# Patient Record
Sex: Male | Born: 1980
Health system: Southern US, Community
[De-identification: ages and names within clinical notes are randomized; demographics above are authoritative.]

## PROBLEM LIST (undated history)

## (undated) DIAGNOSIS — T7840XA Allergy, unspecified, initial encounter: Secondary | ICD-10-CM

## (undated) DIAGNOSIS — K219 Gastro-esophageal reflux disease without esophagitis: Secondary | ICD-10-CM

## (undated) DIAGNOSIS — N189 Chronic kidney disease, unspecified: Secondary | ICD-10-CM

## (undated) DIAGNOSIS — F909 Attention-deficit hyperactivity disorder, unspecified type: Secondary | ICD-10-CM

## (undated) DIAGNOSIS — F329 Major depressive disorder, single episode, unspecified: Secondary | ICD-10-CM

## (undated) DIAGNOSIS — F319 Bipolar disorder, unspecified: Secondary | ICD-10-CM

## (undated) DIAGNOSIS — F32A Depression, unspecified: Secondary | ICD-10-CM

## (undated) DIAGNOSIS — R630 Anorexia: Secondary | ICD-10-CM

## (undated) DIAGNOSIS — F4010 Social phobia, unspecified: Secondary | ICD-10-CM

## (undated) DIAGNOSIS — Z5189 Encounter for other specified aftercare: Secondary | ICD-10-CM

## (undated) DIAGNOSIS — R112 Nausea with vomiting, unspecified: Secondary | ICD-10-CM

## (undated) HISTORY — DX: Attention-deficit hyperactivity disorder, unspecified type: F90.9

## (undated) HISTORY — DX: Nausea with vomiting, unspecified: R11.2

## (undated) HISTORY — DX: Allergy, unspecified, initial encounter: T78.40XA

## (undated) HISTORY — PX: HERNIA REPAIR: SHX51

## (undated) HISTORY — DX: Anorexia: R63.0

## (undated) HISTORY — DX: Social phobia, unspecified: F40.10

## (undated) HISTORY — DX: Encounter for other specified aftercare: Z51.89

## (undated) HISTORY — DX: Major depressive disorder, single episode, unspecified: F32.9

## (undated) HISTORY — DX: Depression, unspecified: F32.A

## (undated) HISTORY — DX: Gastro-esophageal reflux disease without esophagitis: K21.9

---
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
## 2016-08-16 HISTORY — PX: HIATAL HERNIA REPAIR: SHX195

## 2017-12-29 ENCOUNTER — Encounter: Payer: Self-pay | Admitting: Family Medicine

## 2017-12-29 ENCOUNTER — Ambulatory Visit (INDEPENDENT_AMBULATORY_CARE_PROVIDER_SITE_OTHER): Payer: Self-pay | Admitting: Family Medicine

## 2017-12-29 VITALS — BP 108/76 | HR 87 | Temp 98.3°F | Ht 71.0 in | Wt 186.0 lb

## 2017-12-29 DIAGNOSIS — G2581 Restless legs syndrome: Secondary | ICD-10-CM

## 2017-12-29 MED ORDER — ROPINIROLE HCL 0.5 MG PO TABS: ORAL_TABLET | ORAL | 1 refills | Status: DC

## 2017-12-29 NOTE — Patient Instructions (Addendum)
Do not take medicine until I get you the results of your labs. If you do need to take it, use it nightly.  Consider wrapping something involving compression like long Tirso's or compression wear.  Let us know if you need anything.

## 2017-12-29 NOTE — Progress Notes (Signed)
Pre visit review using our clinic review tool, if applicable. No additional management support is needed unless otherwise documented below in the visit note. 

## 2017-12-29 NOTE — Progress Notes (Signed)
Chief Complaint  Patient presents with  . Establish Care       New Patient Visit SUBJECTIVE: HPI: Reginald Dunn is an 37 y.o.male who is being seen for establishing care.  The patient was previously seen in [place] before moving down with parents.   Restless leg symptoms over past couple years, getting worse. Feels urge to move legs, worse when he's lying down, better when he moves it. Usually at night. No pain or injury. No areas of easy bruising or bleeding. Mom and dad do not have this issue. No caffeine correlation. Denies weakness, numbness or tingling. Has tried massage and oils without relief.  No Known Allergies  Past Medical History:  Diagnosis Date  . ADHD   . Depression   . Social anxiety disorder    Past Surgical History:  Procedure Laterality Date  . HIATAL HERNIA REPAIR  08/2016   Social History   Socioeconomic History  . Marital status: Single  Tobacco Use  . Smoking status: Current Every Day Smoker  . Smokeless tobacco: Never Used  Substance and Sexual Activity  . Alcohol use: Yes    Comment: socially   History reviewed. No pertinent family history.   Current Outpatient Medications:  .  clonazePAM (KLONOPIN) 0.5 MG tablet, Take 0.5 mg by mouth 2 (two) times daily., Disp: , Rfl:  .  gabapentin (NEURONTIN) 600 MG tablet, Take 600 mg by mouth 2 (two) times daily., Disp: , Rfl:  .  QUEtiapine (SEROQUEL) 400 MG tablet, Take 2 and 1/2 tablets by mouth daily, Disp: , Rfl:  .  risperiDONE (RISPERDAL) 1 MG tablet, Take 5 at night, Disp: , Rfl:  .  rOPINIRole (REQUIP) 0.5 MG tablet, Take 1/2 tab 3 hours before bed for 2 days then 1 tab daily 3 hours before bed., Disp: 30 tablet, Rfl: 1  ROS Cardiovascular: Denies chest pain  Respiratory: Denies dyspnea   OBJECTIVE: BP 108/76 (BP Location: Left Arm, Patient Position: Sitting, Cuff Size: Normal)   Pulse 87   Temp 98.3 F (36.8 C) (Oral)   Ht 5\' 11"  (1.803 m)   Wt 186 lb (84.4 kg)   SpO2 97%   BMI 25.94 kg/m    Constitutional: -  VS reviewed -  Well developed, well nourished, appears stated age -  No apparent distress  Psychiatric: -  Oriented to person, place, and time -  Memory intact -  Affect and mood normal -  Fluent conversation, good eye contact -  Judgment and insight age appropriate  Eye: -  Conjunctivae clear, no discharge -  Pupils symmetric, round, reactive to light  ENMT: -  MMM    Pharynx moist, no exudate, no erythema  Neck: -  No gross swelling, no palpable masses -  Thyroid midline, not enlarged, mobile, no palpable masses  Cardiovascular: -  RRR -  No LE edema  Respiratory: -  Normal respiratory effort, no accessory muscle use, no retraction -  Breath sounds equal, no wheezes, no ronchi, no crackles  Gastrointestinal: -  Bowel sounds normal -  No tenderness, no distention, no guarding, no masses  Neurological:  -  CN II - XII grossly intact -  DTR's equal and symmetric throughout -  Sensation grossly intact to light touch, equal bilaterally  Musculoskeletal: -  No clubbing, no cyanosis -  Gait normal -  5/5 strength throughout  Skin: -  No significant lesion on inspection -  Warm and dry to palpation   ASSESSMENT/PLAN: Restless legs syndrome (RLS) -  Plan: rOPINIRole (REQUIP) 0.5 MG tablet, CBC, Comprehensive metabolic panel, IBC panel, Ferritin  Ck Fe and electrolytes.  Requip if no lab abn. Patient should return in 6 weeks to recheck med. The patient voiced understanding and agreement to the plan.   [name] Paul [place], DO 12/29/17  2:49 PM

## 2017-12-30 LAB — CBC
HEMATOCRIT: 41.5 % (ref 39.0–52.0)
Hemoglobin: 14.5 g/dL (ref 13.0–17.0)
MCHC: 35 g/dL (ref 30.0–36.0)
MCV: 92.7 fl (ref 78.0–100.0)
PLATELETS: 196 10*3/uL (ref 150.0–400.0)
RBC: 4.48 Mil/uL (ref 4.22–5.81)
RDW: 13.5 % (ref 11.5–15.5)
WBC: 8.1 10*3/uL (ref 4.0–10.5)

## 2017-12-30 LAB — COMPREHENSIVE METABOLIC PANEL
ALBUMIN: 4.4 g/dL (ref 3.5–5.2)
ALT: 17 U/L (ref 0–53)
AST: 15 U/L (ref 0–37)
Alkaline Phosphatase: 71 U/L (ref 39–117)
BUN: 15 mg/dL (ref 6–23)
CHLORIDE: 101 meq/L (ref 96–112)
CO2: 30 mEq/L (ref 19–32)
Calcium: 10.2 mg/dL (ref 8.4–10.5)
Creatinine, Ser: 1.22 mg/dL (ref 0.40–1.50)
GFR: 70.81 mL/min (ref >60.00)
Glucose, Bld: 92 mg/dL (ref 70–99)
POTASSIUM: 4.3 meq/L (ref 3.5–5.1)
Sodium: 138 mEq/L (ref 135–145)
Total Bilirubin: 0.3 mg/dL (ref 0.2–1.2)
Total Protein: 7.1 g/dL (ref 6.0–8.3)

## 2017-12-30 LAB — IBC PANEL
IRON: 71 ug/dL (ref 42–165)
SATURATION RATIOS: 16.8 % — AB (ref 20.0–50.0)
Transferrin: 302 mg/dL (ref 212.0–360.0)

## 2017-12-30 LAB — FERRITIN: FERRITIN: 31.4 ng/mL (ref 22.0–322.0)

## 2018-02-09 ENCOUNTER — Ambulatory Visit (INDEPENDENT_AMBULATORY_CARE_PROVIDER_SITE_OTHER): Payer: Self-pay | Admitting: Family Medicine

## 2018-02-09 ENCOUNTER — Encounter: Payer: Self-pay | Admitting: Family Medicine

## 2018-02-09 VITALS — BP 102/76 | HR 90 | Temp 98.4°F | Ht 71.0 in | Wt 185.4 lb

## 2018-02-09 DIAGNOSIS — G2581 Restless legs syndrome: Secondary | ICD-10-CM

## 2018-02-09 MED ORDER — ROPINIROLE HCL 2 MG PO TABS: 2.0000 mg | ORAL_TABLET | Freq: Every day | ORAL | 1 refills | Status: DC

## 2018-02-09 NOTE — Progress Notes (Signed)
Pre visit review using our clinic review tool, if applicable. No additional management support is needed unless otherwise documented below in the visit note. 

## 2018-02-09 NOTE — Patient Instructions (Addendum)
Take the new medicine tonight.   Send me a MyChart message in 2 weeks if you are not doing any better.  Try to take 1 iron pill daily to see if it upsets your stomach.  Let us know if you need anything.

## 2018-02-09 NOTE — Progress Notes (Signed)
Chief Complaint  Patient presents with  . Follow-up    restless legs   Reginald RuizJohn is a 37 year old white male here for follow-up RLS.  He was started on a few days of 0.5 mg nightly of Requip and increase to 1 mg nightly.  He has not noticed any change.  His ferritin levels were suboptimal at 31.4 and he was instructed to take supplemental iron, which she has been unable to tolerate due to abdominal upset.  ROS:  Neuro: +RLS  Past Medical History:  Diagnosis Date  . ADHD   . Depression   . Social anxiety disorder    BP 102/76 (BP Location: Left Arm, Patient Position: Sitting, Cuff Size: Normal)   Pulse 90   Temp 98.4 F (36.9 C) (Oral)   Ht 5\' 11"  (1.803 m)   Wt 185 lb 6 oz (84.1 kg)   SpO2 96%   BMI 25.85 kg/m  Gen- awake Lungs- no access muscle use Neuro- gait normal, no tremor Psych- age appropriate judgment and insight, nml affect and mood  Restless legs syndrome (RLS) - Plan: rOPINIRole (REQUIP) 2 MG tablet  Increase dose to 2 mg nightly.  Send message in 2 weeks if no improvement and we will increase to 3 mg nightly.  Encouraged to take at least 1 tab daily of supplemental iron. Follow-up in 4 weeks. The patient voiced understanding and agreement to the plan.  [name] [name] 12:16 PM 02/09/18

## 2018-02-20 ENCOUNTER — Encounter: Payer: Self-pay | Admitting: Family Medicine

## 2018-02-21 ENCOUNTER — Other Ambulatory Visit: Payer: Self-pay | Admitting: Family Medicine

## 2018-02-21 MED ORDER — PRAMIPEXOLE DIHYDROCHLORIDE ER 0.375 MG PO TB24: 0.3750 mg | ORAL_TABLET | Freq: Every day | ORAL | 1 refills | Status: DC

## 2018-02-25 ENCOUNTER — Encounter: Payer: Self-pay | Admitting: Family Medicine

## 2018-02-26 MED ORDER — PROPRANOLOL HCL 20 MG PO TABS: 20.0000 mg | ORAL_TABLET | Freq: Two times a day (BID) | ORAL | 0 refills | Status: DC

## 2018-02-26 NOTE — Telephone Encounter (Signed)
Stopped Mirapex 2/2 cost. Called in propranolol, if no improvement will try to refer to Neurology.

## 2018-03-14 ENCOUNTER — Ambulatory Visit (INDEPENDENT_AMBULATORY_CARE_PROVIDER_SITE_OTHER): Payer: Self-pay | Admitting: Family Medicine

## 2018-03-14 ENCOUNTER — Encounter: Payer: Self-pay | Admitting: Family Medicine

## 2018-03-14 VITALS — BP 112/76 | HR 94 | Temp 98.4°F | Ht 71.0 in | Wt 190.0 lb

## 2018-03-14 DIAGNOSIS — G2581 Restless legs syndrome: Secondary | ICD-10-CM

## 2018-03-14 MED ORDER — PROPRANOLOL HCL 40 MG PO TABS: 40.0000 mg | ORAL_TABLET | Freq: Two times a day (BID) | ORAL | 2 refills | Status: DC

## 2018-03-14 NOTE — Progress Notes (Signed)
CC: RLS  Subjective: Patient is a 37 y.o. male here for f/u RLS.  Started on propranolol 20 mg bid and reports around 70% improvement. Still lasts as long, but not as frequent. No other areas of shaking on body, still at night. Failed Requip at various dosing and Mirapex was too expensive.   ROS: Neuro: As noted in HPI  Past Medical History:  Diagnosis Date  . ADHD   . Depression   . Social anxiety disorder     Objective: BP 112/76 (BP Location: Left Arm, Patient Position: Sitting, Cuff Size: Normal)   Pulse 94   Temp 98.4 F (36.9 C) (Oral)   Ht  (1.803 m)   Wt 190 lb (86.2 kg)   SpO2 97%   BMI 26.50 kg/m  General: Awake, appears stated age Lungs: CTAB, no rales, wheezes or rhonchi. No accessory muscle use MSK: 5/5 strength in LE's b/l Neuro: DTR's equal and symmetric b/l LE's Psych: Age appropriate judgment and insight  Assessment and Plan: Restless legs syndrome (RLS) - Plan: propranolol (INDERAL) 40 MG tablet  Increase dose to 40 mg bid from 20 mg bid to see if this can help even more.  Check in via [facility] in 6 weeks. F/u with me in 3 mo. The patient voiced understanding and agreement to the plan.  Jilda Roche [place], DO 03/14/18  10:18 AM

## 2018-03-14 NOTE — Progress Notes (Signed)
Pre visit review using our clinic review tool, if applicable. No additional management support is needed unless otherwise documented below in the visit note. 

## 2018-03-14 NOTE — Patient Instructions (Addendum)
Send me a MyChart message in 6 weeks to check in and see how we are doing.   Continue twice daily medicine.   Let us know if you need anything.

## 2018-05-05 ENCOUNTER — Encounter: Payer: Self-pay | Admitting: Family Medicine

## 2018-06-14 ENCOUNTER — Encounter: Payer: Self-pay | Admitting: Family Medicine

## 2018-06-14 DIAGNOSIS — G2581 Restless legs syndrome: Secondary | ICD-10-CM

## 2018-06-15 ENCOUNTER — Ambulatory Visit: Payer: Self-pay | Admitting: Family Medicine

## 2018-06-15 MED ORDER — PROPRANOLOL HCL 40 MG PO TABS: 40.0000 mg | ORAL_TABLET | Freq: Two times a day (BID) | ORAL | 2 refills | Status: DC

## 2018-07-30 ENCOUNTER — Encounter: Payer: Self-pay | Admitting: *Deleted

## 2018-08-19 ENCOUNTER — Ambulatory Visit (INDEPENDENT_AMBULATORY_CARE_PROVIDER_SITE_OTHER): Payer: Medicare Other | Admitting: Psychiatry

## 2018-08-19 DIAGNOSIS — F259 Schizoaffective disorder, unspecified: Secondary | ICD-10-CM

## 2018-08-19 DIAGNOSIS — F411 Generalized anxiety disorder: Secondary | ICD-10-CM | POA: Diagnosis not present

## 2018-08-19 MED ORDER — PAROXETINE HCL 20 MG PO TABS: 20.0000 mg | ORAL_TABLET | Freq: Every day | ORAL | 2 refills | Status: DC

## 2018-08-19 MED ORDER — QUETIAPINE FUMARATE 400 MG PO TABS: 1000.0000 mg | ORAL_TABLET | Freq: Every day | ORAL | 2 refills | Status: DC

## 2018-08-19 MED ORDER — RISPERIDONE 1 MG PO TABS: 5.0000 mg | ORAL_TABLET | Freq: Every day | ORAL | 2 refills | Status: DC

## 2018-08-19 MED ORDER — GABAPENTIN 600 MG PO TABS: 1200.0000 mg | ORAL_TABLET | Freq: Two times a day (BID) | ORAL | 2 refills | Status: DC

## 2018-08-19 MED ORDER — ALPRAZOLAM 1 MG PO TABS: 1.0000 mg | ORAL_TABLET | Freq: Three times a day (TID) | ORAL | 2 refills | Status: DC

## 2018-08-19 NOTE — Progress Notes (Addendum)
[facility]  Patient ID: Reginald Dunn,  MRN: [id number]  PCP: Sharlene Dory, DO  Date of Evaluation: 08/19/2018 Time spent:20 minutes   HISTORY/CURRENT STATUS: HPI patient is a 37 year old white male with diagnoses of anxiety schizoaffective disorder with history of SA ADHD.  Last seen 12 05/27/2018.  He was doing well so no change.  Individual Medical History/ Review of Systems: Changes? :No  Allergies: Patient has no known allergies.  Current Medications:  Current Outpatient Medications:  .  QUEtiapine (SEROQUEL) 400 MG tablet, Take 2.5 tablets (1,000 mg total) by mouth at bedtime. Take 2 and 1/2 tablets by mouth daily, Disp: 75 tablet, Rfl: 2 .  risperiDONE (RISPERDAL) 1 MG tablet, Take 5 tablets (5 mg total) by mouth at bedtime. Take 5 at night, Disp: 150 tablet, Rfl: 2 .  ALPRAZolam (XANAX) 1 MG tablet, Take 1 tablet (1 mg total) by mouth 3 (three) times daily., Disp: 90 tablet, Rfl: 2 .  gabapentin (NEURONTIN) 600 MG tablet, Take 2 tablets (1,200 mg total) by mouth 2 (two) times daily., Disp: 120 tablet, Rfl: 2 .  PARoxetine (PAXIL) 20 MG tablet, Take 1 tablet (20 mg total) by mouth daily., Disp: 30 tablet, Rfl: 2 .  propranolol (INDERAL) 40 MG tablet, Take 1 tablet (40 mg total) by mouth 2 (two) times daily. (Patient not taking: Reported on 08/19/2018), Disp: 60 tablet, Rfl: 2 Medication Side Effects: None  Family Medical/ Social History: Changes? No  MENTAL HEALTH EXAM:  There were no vitals taken for this visit.There is no height or weight on file to calculate BMI.  General Appearance: Casual  Eye Contact:  Good  Speech:  Normal Rate  Volume:  Normal  Mood:  Depressed  Affect:  Appropriate  Thought Process:  Linear  Orientation:  Full (Time, Place, and Person)  Thought Content: Logical   Suicidal Thoughts:  No  Homicidal Thoughts:  No  Memory:  Immediate  Judgement:  Good  Insight:  Good  Psychomotor Activity:  Normal  Concentration:   Concentration: Good  Recall:  Good  Fund of Knowledge: Good  Language: Good  Akathisia:  No  AIMS (if indicated): not done  Assets:  Desire for Improvement Social Support  ADL's:  Intact  Cognition: WNL  Prognosis:  Good    DIAGNOSES:    ICD-10-CM   1. Schizoaffective disorder, unspecified type (HCC) F25.9 QUEtiapine (SEROQUEL) 400 MG tablet    risperiDONE (RISPERDAL) 1 MG tablet  2. Generalized anxiety disorder F41.1 PARoxetine (PAXIL) 20 MG tablet    gabapentin (NEURONTIN) 600 MG tablet    QUEtiapine (SEROQUEL) 400 MG tablet    risperiDONE (RISPERDAL) 1 MG tablet    ALPRAZolam (XANAX) 1 MG tablet    RECOMMENDATIONS: Patient was instructed to increase his Paxil to 20 mg a day for his depression.  He is to call if he has any manic symptoms.  He will return in 3 months.  This patient is on Paxil he is to continue his same dose of Risperdal.    Anne Fu, PA-C

## 2018-10-28 ENCOUNTER — Other Ambulatory Visit: Payer: Self-pay | Admitting: Psychiatry

## 2018-10-28 DIAGNOSIS — F411 Generalized anxiety disorder: Secondary | ICD-10-CM

## 2018-10-31 ENCOUNTER — Telehealth: Payer: Self-pay | Admitting: Psychiatry

## 2018-10-31 NOTE — Telephone Encounter (Signed)
Need to contact pt for pharmacy and clarify medications

## 2018-10-31 NOTE — Telephone Encounter (Signed)
Patient requesting refills on meds. Appointment not scheduled until 1/7.

## 2018-11-01 ENCOUNTER — Other Ambulatory Visit: Payer: Self-pay

## 2018-11-01 DIAGNOSIS — F411 Generalized anxiety disorder: Secondary | ICD-10-CM

## 2018-11-01 DIAGNOSIS — F259 Schizoaffective disorder, unspecified: Secondary | ICD-10-CM

## 2018-11-01 MED ORDER — PAROXETINE HCL 20 MG PO TABS: 20.0000 mg | ORAL_TABLET | Freq: Every day | ORAL | 0 refills | Status: DC

## 2018-11-01 MED ORDER — QUETIAPINE FUMARATE 400 MG PO TABS: 1000.0000 mg | ORAL_TABLET | Freq: Every day | ORAL | 0 refills | Status: DC

## 2018-11-01 MED ORDER — GABAPENTIN 600 MG PO TABS: 1200.0000 mg | ORAL_TABLET | Freq: Two times a day (BID) | ORAL | 0 refills | Status: DC

## 2018-11-01 MED ORDER — RISPERIDONE 1 MG PO TABS: 5.0000 mg | ORAL_TABLET | Freq: Every day | ORAL | 0 refills | Status: DC

## 2018-11-01 MED ORDER — ALPRAZOLAM 1 MG PO TABS: 1.0000 mg | ORAL_TABLET | Freq: Three times a day (TID) | ORAL | 0 refills | Status: DC

## 2018-11-01 NOTE — Telephone Encounter (Signed)
See paper chart for refills and pharmacy

## 2018-11-22 ENCOUNTER — Ambulatory Visit (INDEPENDENT_AMBULATORY_CARE_PROVIDER_SITE_OTHER): Payer: Medicare Other | Admitting: Psychiatry

## 2018-11-22 DIAGNOSIS — F411 Generalized anxiety disorder: Secondary | ICD-10-CM

## 2018-11-22 DIAGNOSIS — F258 Other schizoaffective disorders: Secondary | ICD-10-CM | POA: Diagnosis not present

## 2018-11-22 DIAGNOSIS — F259 Schizoaffective disorder, unspecified: Secondary | ICD-10-CM

## 2018-11-22 MED ORDER — PAROXETINE HCL 20 MG PO TABS: 20.0000 mg | ORAL_TABLET | Freq: Every day | ORAL | 2 refills | Status: DC

## 2018-11-22 MED ORDER — GABAPENTIN 600 MG PO TABS: 1200.0000 mg | ORAL_TABLET | Freq: Two times a day (BID) | ORAL | 2 refills | Status: DC

## 2018-11-22 MED ORDER — ALPRAZOLAM 1 MG PO TABS: 1.0000 mg | ORAL_TABLET | Freq: Three times a day (TID) | ORAL | 2 refills | Status: DC

## 2018-11-22 MED ORDER — RISPERIDONE 1 MG PO TABS: 5.0000 mg | ORAL_TABLET | Freq: Every day | ORAL | 2 refills | Status: DC

## 2018-11-22 MED ORDER — QUETIAPINE FUMARATE 400 MG PO TABS: 1000.0000 mg | ORAL_TABLET | Freq: Every day | ORAL | 2 refills | Status: DC

## 2018-11-22 NOTE — Progress Notes (Signed)
[facility]  Patient ID: Reginald Dunn,  MRN: [id number]  PCP: Sharlene Dory, DO  Date of Evaluation: 11/22/2018 Time spent:20 minutes  Chief Complaint:   HISTORY/CURRENT STATUS: HPI seen 08/19/2018.  Basically he is doing the same.  He has mild anxiety.  He is having no manic symptoms.  He does have some depression but denies suicidal thoughts.  Individual Medical History/ Review of Systems: Changes? :No   Allergies: Patient has no known allergies.  Current Medications:  Current Outpatient Medications:  .  ALPRAZolam (XANAX) 1 MG tablet, Take 1 tablet (1 mg total) by mouth 3 (three) times daily., Disp: 90 tablet, Rfl: 0 .  gabapentin (NEURONTIN) 600 MG tablet, Take 2 tablets (1,200 mg total) by mouth 2 (two) times daily., Disp: 120 tablet, Rfl: 0 .  PARoxetine (PAXIL) 20 MG tablet, Take 1 tablet (20 mg total) by mouth daily., Disp: 30 tablet, Rfl: 0 .  propranolol (INDERAL) 40 MG tablet, Take 1 tablet (40 mg total) by mouth 2 (two) times daily. (Patient not taking: Reported on 08/19/2018), Disp: 60 tablet, Rfl: 2 .  QUEtiapine (SEROQUEL) 400 MG tablet, Take 2.5 tablets (1,000 mg total) by mouth at bedtime. Take 2 and 1/2 tablets by mouth daily, Disp: 75 tablet, Rfl: 0 .  risperiDONE (RISPERDAL) 1 MG tablet, Take 5 tablets (5 mg total) by mouth at bedtime. Take 5 at night, Disp: 150 tablet, Rfl: 0 Medication Side Effects: none  Family Medical/ Social History: Changes? no MENTAL HEALTH EXAM:  There were no vitals taken for this visit.There is no height or weight on file to calculate BMI.  General Appearance: Neat  Eye Contact:  Fair  Speech:  Normal Rate  Volume:  Normal  Mood:  Depressed  Affect:  Blunt  Thought Process:  Linear  Orientation:  Full (Time, Place, and Person)  Thought Content: Logical   Suicidal Thoughts:  No  Homicidal Thoughts:  No  Memory:  WNL  Judgement:  Good  Insight:  Good  Psychomotor Activity:  Normal  Concentration:   Concentration: Good  Recall:  Good  Fund of Knowledge: Good  Language: Good  Assets:  Desire for Improvement  ADL's:  Intact  Cognition: WNL  Prognosis:  Fair    DIAGNOSES: No diagnosis found.  Receiving Psychotherapy: No    RECOMMENDATIONS: Renal medication change for the patient today.  Per his request.  He is on Paxil 20 mg a day, Risperdal 5 mg at bedtime, Seroquel  400 mg 2-1/2 tabs a day.  He also takes Xanax 1 mg 3 times daily his mother keeps his prescription.  Gabapentin 600 mg 2 twice daily. Patient wishes to stay on same medication.  He is to return in 3 months.   Anne Fu, PA-C

## 2018-12-13 ENCOUNTER — Emergency Department (HOSPITAL_BASED_OUTPATIENT_CLINIC_OR_DEPARTMENT_OTHER)
Admission: EM | Admit: 2018-12-13 | Discharge: 2018-12-13 | Disposition: A | Discharge status: Home / Self Care | Payer: Medicare Other | Attending: Emergency Medicine | Admitting: Emergency Medicine

## 2018-12-13 ENCOUNTER — Encounter (HOSPITAL_BASED_OUTPATIENT_CLINIC_OR_DEPARTMENT_OTHER): Payer: Self-pay | Admitting: Emergency Medicine

## 2018-12-13 ENCOUNTER — Emergency Department (HOSPITAL_BASED_OUTPATIENT_CLINIC_OR_DEPARTMENT_OTHER): Payer: Medicare Other

## 2018-12-13 ENCOUNTER — Other Ambulatory Visit: Payer: Self-pay

## 2018-12-13 DIAGNOSIS — R1013 Epigastric pain: Secondary | ICD-10-CM

## 2018-12-13 DIAGNOSIS — F17210 Nicotine dependence, cigarettes, uncomplicated: Secondary | ICD-10-CM | POA: Diagnosis not present

## 2018-12-13 DIAGNOSIS — Z79899 Other long term (current) drug therapy: Secondary | ICD-10-CM | POA: Diagnosis not present

## 2018-12-13 DIAGNOSIS — R112 Nausea with vomiting, unspecified: Secondary | ICD-10-CM | POA: Diagnosis not present

## 2018-12-13 DIAGNOSIS — K449 Diaphragmatic hernia without obstruction or gangrene: Secondary | ICD-10-CM | POA: Diagnosis not present

## 2018-12-13 DIAGNOSIS — F909 Attention-deficit hyperactivity disorder, unspecified type: Secondary | ICD-10-CM | POA: Insufficient documentation

## 2018-12-13 LAB — URINALYSIS, ROUTINE W REFLEX MICROSCOPIC
Bilirubin Urine: NEGATIVE
Glucose, UA: NEGATIVE mg/dL
Hgb urine dipstick: NEGATIVE
KETONES UR: 40 mg/dL — AB
Leukocytes, UA: NEGATIVE
Nitrite: NEGATIVE
PH: 8.5 — AB (ref 5.0–8.0)
Protein, ur: 30 mg/dL — AB
Specific Gravity, Urine: 1.015 (ref 1.005–1.030)

## 2018-12-13 LAB — CBC WITH DIFFERENTIAL/PLATELET
Abs Immature Granulocytes: 0.04 10*3/uL (ref 0.00–0.07)
Basophils Absolute: 0 10*3/uL (ref 0.0–0.1)
Basophils Relative: 0 %
EOS ABS: 0 10*3/uL (ref 0.0–0.5)
Eosinophils Relative: 0 %
HCT: 40.6 % (ref 39.0–52.0)
Hemoglobin: 13.9 g/dL (ref 13.0–17.0)
Immature Granulocytes: 0 %
Lymphocytes Relative: 9 %
Lymphs Abs: 1 10*3/uL (ref 0.7–4.0)
MCH: 31.9 pg (ref 26.0–34.0)
MCHC: 34.2 g/dL (ref 30.0–36.0)
MCV: 93.1 fL (ref 80.0–100.0)
Monocytes Absolute: 0.7 10*3/uL (ref 0.1–1.0)
Monocytes Relative: 6 %
Neutro Abs: 9.6 10*3/uL — ABNORMAL HIGH (ref 1.7–7.7)
Neutrophils Relative %: 85 %
Platelets: 169 10*3/uL (ref 150–400)
RBC: 4.36 MIL/uL (ref 4.22–5.81)
RDW: 13 % (ref 11.5–15.5)
WBC: 11.3 10*3/uL — ABNORMAL HIGH (ref 4.0–10.5)
nRBC: 0 % (ref 0.0–0.2)

## 2018-12-13 LAB — COMPREHENSIVE METABOLIC PANEL
ALT: 19 U/L (ref 0–44)
AST: 17 U/L (ref 15–41)
Albumin: 4.5 g/dL (ref 3.5–5.0)
Alkaline Phosphatase: 63 U/L (ref 38–126)
Anion gap: 9 (ref 5–15)
BILIRUBIN TOTAL: 0.5 mg/dL (ref 0.3–1.2)
BUN: 12 mg/dL (ref 6–20)
CO2: 22 mmol/L (ref 22–32)
Calcium: 9.3 mg/dL (ref 8.9–10.3)
Chloride: 107 mmol/L (ref 98–111)
Creatinine, Ser: 0.9 mg/dL (ref 0.61–1.24)
GFR calc Af Amer: >60 mL/min (ref >60)
GFR calc non Af Amer: >60 mL/min (ref >60)
Glucose, Bld: 115 mg/dL — ABNORMAL HIGH (ref 70–99)
POTASSIUM: 3.4 mmol/L — AB (ref 3.5–5.1)
Sodium: 138 mmol/L (ref 135–145)
TOTAL PROTEIN: 7 g/dL (ref 6.5–8.1)

## 2018-12-13 LAB — URINALYSIS, MICROSCOPIC (REFLEX)

## 2018-12-13 LAB — LIPASE, BLOOD: Lipase: 38 U/L (ref 11–51)

## 2018-12-13 MED ORDER — SODIUM CHLORIDE 0.9 % IV BOLUS
1000.0000 mL | Freq: Once | INTRAVENOUS | Status: AC
  Administered 2018-12-13: 1000 mL via INTRAVENOUS

## 2018-12-13 MED ORDER — SODIUM CHLORIDE 0.9 % IV BOLUS: 1000.0000 mL | Freq: Once | INTRAVENOUS | Status: DC

## 2018-12-13 MED ORDER — METOCLOPRAMIDE HCL 10 MG PO TABS: 10.0000 mg | ORAL_TABLET | Freq: Four times a day (QID) | ORAL | 0 refills | Status: DC

## 2018-12-13 MED ORDER — IOPAMIDOL (ISOVUE-300) INJECTION 61%
100.0000 mL | Freq: Once | INTRAVENOUS | Status: AC | PRN
  Administered 2018-12-13: 100 mL via INTRAVENOUS

## 2018-12-13 MED ORDER — ONDANSETRON HCL 4 MG/2ML IJ SOLN
4.0000 mg | Freq: Once | INTRAMUSCULAR | Status: AC
  Administered 2018-12-13: 4 mg via INTRAVENOUS
  Filled 2018-12-13: qty 2

## 2018-12-13 MED ORDER — METOCLOPRAMIDE HCL 5 MG/ML IJ SOLN
10.0000 mg | Freq: Once | INTRAMUSCULAR | Status: AC
  Administered 2018-12-13: 10 mg via INTRAVENOUS
  Filled 2018-12-13: qty 2

## 2018-12-13 MED FILL — METOCLOPRAMIDE 10 MG TABLET: 10 | 2 days supply | Qty: 8 | Fill #0

## 2018-12-13 NOTE — ED Provider Notes (Signed)
[facility] EMERGENCY DEPARTMENT Provider Note   CSN: [id number] Arrival date & time: 12/13/18  1010     History   Chief Complaint Chief Complaint  Patient presents with  . Emesis    HPI Reginald Dunn is a 38 y.o. male who presents with abdominal pain, nausea and vomiting.  Past medical history significant for hiatal hernia status post repair.  His parents are at bedside who help provide history.  [place]  Patient states that at about 1 AM he woke up with an acute onset of nausea and vomiting.  He has had multiple episodes and possible hematemesis.  His last episode of vomiting was around 9:00 this morning.  He denies fever, chills, chest pain, shortness of breath, diarrhea.  Yesterday he was in his usual state of health.  He has not had any sick contacts.  His parents note that he has had symptoms of early satiety and unintentional weight loss of about 25 pounds over the past couple of months.  HPI  Past Medical History:  Diagnosis Date  . ADHD   . Depression   . Social anxiety disorder     Patient Active Problem List   Diagnosis Date Noted  . Restless legs syndrome (RLS) 12/29/2017    Past Surgical History:  Procedure Laterality Date  . HIATAL HERNIA REPAIR  08/2016        Home Medications    Prior to Admission medications   Medication Sig Start Date End Date Taking? Authorizing Provider  ALPRAZolam Prudy Feeler(XANAX) 1 MG tablet Take 1 tablet (1 mg total) by mouth 3 (three) times daily. 11/22/18   Shugart, Mat Carnelay, PA-C  gabapentin (NEURONTIN) 600 MG tablet Take 2 tablets (1,200 mg total) by mouth 2 (two) times daily. 11/22/18   Shugart, Mat Carnelay, PA-C  PARoxetine (PAXIL) 20 MG tablet Take 1 tablet (20 mg total) by mouth daily. 11/22/18   Shugart, Mat Carnelay, PA-C  propranolol (INDERAL) 40 MG tablet Take 1 tablet (40 mg total) by mouth 2 (two) times daily. Patient not taking: Reported on 08/19/2018 06/15/18   Sharlene DoryWendling, Nicholas Paul, DO  QUEtiapine  (SEROQUEL) 400 MG tablet Take 2.5 tablets (1,000 mg total) by mouth at bedtime. Take 2 and 1/2 tablets by mouth daily 11/22/18   Shugart, Mat Carnelay, PA-C  risperiDONE (RISPERDAL) 1 MG tablet Take 5 tablets (5 mg total) by mouth at bedtime. Take 5 at night 11/22/18   Anne FuShugart, Clay, PA-C    Family History No family history on file.  Social History Social History   Tobacco Use  . Smoking status: Current Every Day Smoker    Packs/day: 1.00  . Smokeless tobacco: Never Used  Substance Use Topics  . Alcohol use: Yes    Comment: socially  . Drug use: Never     Allergies   Patient has no known allergies.   Review of Systems Review of Systems  Constitutional: Positive for unexpected weight change. Negative for chills and fever.  Respiratory: Negative for shortness of breath.   Cardiovascular: Negative for chest pain.  Gastrointestinal: Positive for abdominal pain, nausea and vomiting. Negative for blood in stool and diarrhea.  Genitourinary: Negative for difficulty urinating, dysuria and flank pain.  All other systems reviewed and are negative.    Physical Exam Updated Vital Signs BP 120/88 (BP Location: Left Arm)   Pulse 70   Temp 97.7 F (36.5 C) (Oral)   Resp 18   Ht 5\' 11"  (1.803 m)   Wt 77.1 kg  SpO2 100%   BMI 23.71 kg/m   Physical Exam Vitals signs and nursing note reviewed.  Constitutional:      General: He is not in acute distress.    Appearance: He is well-developed and well-groomed. He is ill-appearing (Pale).  HENT:     Head: Normocephalic and atraumatic.  Eyes:     General: No scleral icterus.       Right eye: No discharge.        Left eye: No discharge.     Conjunctiva/sclera: Conjunctivae normal.     Pupils: Pupils are equal, round, and reactive to light.  Neck:     Musculoskeletal: Normal range of motion.  Cardiovascular:     Rate and Rhythm: Normal rate and regular rhythm.  Pulmonary:     Effort: Pulmonary effort is normal. No respiratory distress.      Breath sounds: Normal breath sounds.  Abdominal:     General: Abdomen is flat. Bowel sounds are normal. There is no distension.     Palpations: Abdomen is soft.     Tenderness: There is abdominal tenderness (Mild epigastric).     Comments: Multiple well-healed surgical scars  Skin:    General: Skin is warm and dry.  Neurological:     Mental Status: He is alert and oriented to person, place, and time.  Psychiatric:        Behavior: Behavior normal. Behavior is cooperative.      ED Treatments / Results  Labs (all labs ordered are listed, but only abnormal results are displayed) Labs Reviewed  URINALYSIS, ROUTINE W REFLEX MICROSCOPIC - Abnormal; Notable for the following components:      Result Value   APPearance CLOUDY (*)    pH 8.5 (*)    Ketones, ur 40 (*)    Protein, ur 30 (*)    All other components within normal limits  URINALYSIS, MICROSCOPIC (REFLEX) - Abnormal; Notable for the following components:   Bacteria, UA RARE (*)    All other components within normal limits  COMPREHENSIVE METABOLIC PANEL - Abnormal; Notable for the following components:   Potassium 3.4 (*)    Glucose, Bld 115 (*)    All other components within normal limits  CBC WITH DIFFERENTIAL/PLATELET - Abnormal; Notable for the following components:   WBC 11.3 (*)    Neutro Abs 9.6 (*)    All other components within normal limits  LIPASE, BLOOD    EKG None  Radiology Ct Abdomen Pelvis W Contrast  Result Date: 12/13/2018 CLINICAL DATA:  Onset abdominal pain and vomiting last night at 1 a.m. EXAM: CT ABDOMEN AND PELVIS WITH CONTRAST TECHNIQUE: Multidetector CT imaging of the abdomen and pelvis was performed using the standard protocol following bolus administration of intravenous contrast. CONTRAST:  100 mL ISOVUE-300 IOPAMIDOL (ISOVUE-300) INJECTION 61% COMPARISON:  None. FINDINGS: Lower chest: The lung bases are clear. No pleural or pericardial effusion. Hepatobiliary: No focal liver abnormality is  seen. No gallstones, gallbladder wall thickening, or biliary dilatation. Pancreas: Unremarkable. No pancreatic ductal dilatation or surrounding inflammatory changes. Spleen: Normal in size without focal abnormality. Adrenals/Urinary Tract: Adrenal glands are unremarkable. Kidneys are normal, without renal calculi, focal lesion, or hydronephrosis. Bladder is unremarkable. Stomach/Bowel: Small hiatal hernia is noted. The stomach is otherwise unremarkable. Appendix appears normal. No evidence of bowel wall thickening, distention, or inflammatory changes. Vascular/Lymphatic: No significant vascular findings are present. No enlarged abdominal or pelvic lymph nodes. Reproductive: Prostate is unremarkable. Other: None. Musculoskeletal: Negative. IMPRESSION: No acute abnormality abdomen  or pelvis. No finding to explain the patient's symptoms. Small hiatal hernia. Electronically Signed   By: Drusilla Kanner M.D.   On: 12/13/2018 13:52    Procedures Procedures (including critical care time)  Medications Ordered in ED Medications  sodium chloride 0.9 % bolus 1,000 mL ( Intravenous Stopped 12/13/18 1300)  ondansetron (ZOFRAN) injection 4 mg (4 mg Intravenous Given 12/13/18 1145)  metoCLOPramide (REGLAN) injection 10 mg (10 mg Intravenous Given 12/13/18 1236)  iopamidol (ISOVUE-300) 61 % injection 100 mL (100 mLs Intravenous Contrast Given 12/13/18 1332)  sodium chloride 0.9 % bolus 1,000 mL ( Intravenous Stopped 12/13/18 1454)     Initial Impression / Assessment and Plan / ED Course  I have reviewed the triage vital signs and the nursing notes.  Pertinent labs & imaging results that were available during my care of the patient were reviewed by me and considered in my medical decision making (see chart for details).  38 year old male presents with acute onset of nausea and vomiting with epigastric pain since this morning.  His vital signs are normal.  On exam he appears ill and pale.  He is mildly tender in the  epigastric area.  CBC is remarkable for mild leukocytosis.  CMP is remarkable for mild hypokalemia.  LFTs and lipase are normal.  Urine has 40 ketones.  Unclear if this is acute from dehydration versus his decreased oral intake.  His parents are very concerned and said that he might have been vomiting blood earlier today.  Since he appears uncomfortable will obtain CT scan of abdomen and pelvis.  We will give an additional fluid bolus and Reglan.  Informed by nursing that the patient is feeling much better and is requesting to leave.  He was able to tolerate p.o. here.  I reevaluated the patient he appears vastly improved.  We will given prescription for Reglan and strict return precautions.  Final Clinical Impressions(s) / ED Diagnoses   Final diagnoses:  Non-intractable vomiting with nausea, unspecified vomiting type  Epigastric abdominal pain    ED Discharge Orders    None       Bethel Born, PA-C 12/13/18 1717    Tilden Fossa, MD 12/14/18 1003

## 2018-12-13 NOTE — ED Triage Notes (Signed)
Upper abdominal pain and vomiting since 1am.  Pt pale and weak.

## 2018-12-13 NOTE — ED Notes (Signed)
Pt reports feeling better at this time. Requesting to go home

## 2018-12-13 NOTE — ED Notes (Signed)
Unsuccessful attempts x 2 for IV. RN in room to attempt

## 2018-12-13 NOTE — ED Notes (Addendum)
Pt c/o N/V with epigastric pain since 1am today. Pt states felt well yesterday, ate dinner with no issues. Pt denies diarrhea, fever. No other family members sick. Pt denies chest pain or sob

## 2018-12-13 NOTE — Discharge Instructions (Signed)
Take Reglan as needed for nausea/vomiting Please return if you are worsening Follow up with your doctor

## 2018-12-13 NOTE — ED Notes (Signed)
Pt reports epigastric pain and still feeling nauseous.

## 2018-12-14 ENCOUNTER — Encounter: Payer: Self-pay | Admitting: Family Medicine

## 2018-12-14 ENCOUNTER — Ambulatory Visit: Payer: Self-pay

## 2018-12-14 NOTE — Telephone Encounter (Signed)
Pt.'s mother called to report pt. Was seen in ED yesterday with vomiting and was given Reglan. Reports Reglan has not helped and is requesting something different be sent to [facility] [address].Please let pt. Know if this is done.

## 2018-12-15 ENCOUNTER — Other Ambulatory Visit: Payer: Self-pay | Admitting: Family Medicine

## 2018-12-15 MED ORDER — ONDANSETRON 4 MG PO TBDP: 4.0000 mg | ORAL_TABLET | Freq: Three times a day (TID) | ORAL | 0 refills | Status: DC | PRN

## 2018-12-15 NOTE — Addendum Note (Signed)
Addended by: Radene Gunning on: 12/15/2018 07:23 AM   Modules accepted: Orders

## 2018-12-15 NOTE — Telephone Encounter (Signed)
Is he still having issues? I can call something in if so, it would be better in future to sched f/u appt with me if still having problems following an ER visit.

## 2019-01-27 ENCOUNTER — Telehealth: Payer: Self-pay | Admitting: Family Medicine

## 2019-01-27 MED ORDER — PROMETHAZINE HCL 25 MG PO TABS: 25.0000 mg | ORAL_TABLET | Freq: Four times a day (QID) | ORAL | 0 refills | Status: DC | PRN

## 2019-01-27 NOTE — Telephone Encounter (Signed)
Try Phenergan. Will call in. TY.

## 2019-01-27 NOTE — Telephone Encounter (Signed)
Copied from CRM [id number]. Topic: Quick Communication - Rx Refill/Question >> Jan 27, 2019  2:26 PM Maia Petties wrote: Medication: metoCLOPramide (REGLAN) 10 MG tablet & ondansetron (ZOFRAN ODT) 4 MG disintegrating tablet Mother is stating that pt has began another episode of vomiting as he did back in January - 6-8x today so far - she has tried the zofran from last time she had left and it is not helping. She is not wanting to bring Reginald Dunn out due to CV and is asking what else can be prescribed to help stop vomiting. She is willing to bring him in after CV cases begin to lower  Has the patient contacted their pharmacy? No - new request Preferred Pharmacy (with phone number or street name): [facility] [address] [place], [place] - [address] [phone] (Phone) [phone] (Fax)

## 2019-01-27 NOTE — Telephone Encounter (Signed)
Sent in and patient informed 

## 2019-02-13 ENCOUNTER — Other Ambulatory Visit: Payer: Self-pay | Admitting: Psychiatry

## 2019-02-13 DIAGNOSIS — F411 Generalized anxiety disorder: Secondary | ICD-10-CM

## 2019-02-14 NOTE — Telephone Encounter (Signed)
rx's just filled 02/05/2019, too early

## 2019-02-21 ENCOUNTER — Ambulatory Visit: Payer: Medicare Other | Admitting: Psychiatry

## 2019-03-01 NOTE — Telephone Encounter (Signed)
Yes, too early on Xanax.  Also the Paxil was sent in on 02/27/19, so not sure why we were sent that.  Please deny both.

## 2019-03-01 NOTE — Telephone Encounter (Signed)
Last fill 02/05/2019

## 2019-03-03 ENCOUNTER — Encounter: Payer: Self-pay | Admitting: Physician Assistant

## 2019-03-03 ENCOUNTER — Other Ambulatory Visit: Payer: Self-pay

## 2019-03-03 ENCOUNTER — Ambulatory Visit (INDEPENDENT_AMBULATORY_CARE_PROVIDER_SITE_OTHER): Payer: Medicare Other | Admitting: Physician Assistant

## 2019-03-03 DIAGNOSIS — F411 Generalized anxiety disorder: Secondary | ICD-10-CM

## 2019-03-03 DIAGNOSIS — F259 Schizoaffective disorder, unspecified: Secondary | ICD-10-CM

## 2019-03-03 MED ORDER — QUETIAPINE FUMARATE 400 MG PO TABS: 1000.0000 mg | ORAL_TABLET | Freq: Every day | ORAL | 1 refills | Status: DC

## 2019-03-03 MED ORDER — GABAPENTIN 600 MG PO TABS: ORAL_TABLET | ORAL | 1 refills | Status: DC

## 2019-03-03 MED ORDER — PAROXETINE HCL 20 MG PO TABS: 20.0000 mg | ORAL_TABLET | Freq: Every day | ORAL | 1 refills | Status: DC

## 2019-03-03 MED ORDER — RISPERIDONE 1 MG PO TABS: 5.0000 mg | ORAL_TABLET | Freq: Every day | ORAL | 1 refills | Status: DC

## 2019-03-03 MED ORDER — ALPRAZOLAM 1 MG PO TABS: 1.0000 mg | ORAL_TABLET | Freq: Three times a day (TID) | ORAL | 1 refills | Status: DC | PRN

## 2019-03-03 NOTE — Progress Notes (Signed)
[facility]  Patient ID: Reginald Dunn,  MRN: [id number]  PCP: Sharlene Dory, DO  Date of Evaluation: 03/03/2019 Time spent:15 minutes  Chief Complaint:  Chief Complaint    Follow-up     Virtual Visit via Telephone Note  I connected with Reginald Dunn on 03/03/19 at  3:00 PM EDT by telephone and verified that I am speaking with the correct person using two identifiers.   I discussed the limitations, risks, security and privacy concerns of performing an evaluation and management service by telephone and the availability of in person appointments. I also discussed with the patient that there may be a patient responsible charge related to this service. The patient expressed understanding and agreed to proceed.  Attempted contact through video call with WebEx but had technical difficulties.   HISTORY/CURRENT STATUS: HPI for 49-month med check.  His mom is on the phone with him.  Patient complains of increased anxiety.  He wonders if it would be okay to increase the Xanax.  He feels generally anxious, off and on all day, feeling that something bad is going to happen.  Not really having panic attacks.  The Xanax does not seem to be working as well as it did.  He does not feel like doing a whole lot because of the anxiety.  He is isolating now because of the coronavirus pandemic shelter in place orders but even before that, he did not feel like going out very much.  His energy and motivation are good.  He does not cry easily.  Denies suicidal or homicidal thoughts.  Denies hallucinations.  No increased energy or decreased need for sleep.  No impulsivity or risky behaviors.  No increased libido or spending.  No grandiosity.  Denies muscle or joint pain, stiffness, or dystonia.  Denies dizziness, syncope, seizures, numbness, tingling, tremor, tics, unsteady gait, slurred speech, confusion.   Individual Medical History/ Review of Systems: Changes? :No    Allergies: Patient has no known allergies.  Current Medications:  Current Outpatient Medications:  .  ALPRAZolam (XANAX) 1 MG tablet, Take 1 tablet (1 mg total) by mouth 3 (three) times daily as needed for anxiety., Disp: 90 tablet, Rfl: 1 .  gabapentin (NEURONTIN) 600 MG tablet, 2 po q am, 1 q afternoon, 2 qhs., Disp: 150 tablet, Rfl: 1 .  PARoxetine (PAXIL) 20 MG tablet, Take 1 tablet (20 mg total) by mouth daily., Disp: 30 tablet, Rfl: 1 .  QUEtiapine (SEROQUEL) 400 MG tablet, Take 2.5 tablets (1,000 mg total) by mouth at bedtime. Take 2 and 1/2 tablets by mouth daily, Disp: 75 tablet, Rfl: 1 .  risperiDONE (RISPERDAL) 1 MG tablet, Take 5 tablets (5 mg total) by mouth at bedtime. Take 5 at night, Disp: 150 tablet, Rfl: 1 .  promethazine (PHENERGAN) 25 MG tablet, Take 1 tablet (25 mg total) by mouth every 6 (six) hours as needed for nausea or vomiting. (Patient not taking: Reported on 03/03/2019), Disp: 30 tablet, Rfl: 0 Medication Side Effects: none  Family Medical/ Social History: Changes? Yes coronavirus pandemic causing shelter in place for everyone  MENTAL HEALTH EXAM:  There were no vitals taken for this visit.There is no height or weight on file to calculate BMI.  General Appearance: Phone visit, unable to assess  Eye Contact:  Unable to assess  Speech:  Clear and Coherent  Volume:  Normal  Mood:  Euthymic  Affect:  Unable to assess  Thought Process:  Goal Directed  Orientation:  Full (Time,  Place, and Person)  Thought Content: Logical   Suicidal Thoughts:  No  Homicidal Thoughts:  No  Memory:  WNL  Judgement:  Good  Insight:  Good  Psychomotor Activity:  Unable to assess  Concentration:  Concentration: Good  Recall:  Good  Fund of Knowledge: Good  Language: Good  Assets:  Desire for Improvement  ADL's:  Intact  Cognition: WNL  Prognosis:  Good    DIAGNOSES:    ICD-10-CM   1. Generalized anxiety disorder F41.1 gabapentin (NEURONTIN) 600 MG tablet    PARoxetine  (PAXIL) 20 MG tablet    QUEtiapine (SEROQUEL) 400 MG tablet    risperiDONE (RISPERDAL) 1 MG tablet    ALPRAZolam (XANAX) 1 MG tablet  2. Schizoaffective disorder, unspecified type (HCC) F25.9 QUEtiapine (SEROQUEL) 400 MG tablet    risperiDONE (RISPERDAL) 1 MG tablet    Receiving Psychotherapy: No    RECOMMENDATIONS: We discussed the anxiety and different options for treatment.  He is on a pretty high dose of Xanax already so I would prefer to prevent the anxiety than to have to rescue him from it.  He and mom verbalized understanding. Increase gabapentin 600 mg to 2 p.o. every morning 1 q. afternoon and 2 p.o. nightly. Continue Xanax 1 mg 3 times daily as needed. Continue Paxil 20 mg daily. Continue Seroquel 400 mg 2.5 pills daily. Continue Risperdal 1 mg 5 nightly. Return in 6 weeks.  Melony Overlyeresa Varnika Butz, PA-C   This record has been created using [employer] software.  Chart creation errors have been sought, but may not always have been located and corrected. Such creation errors do not reflect on the standard of medical care.

## 2019-03-10 ENCOUNTER — Other Ambulatory Visit: Payer: Self-pay

## 2019-03-10 ENCOUNTER — Ambulatory Visit (INDEPENDENT_AMBULATORY_CARE_PROVIDER_SITE_OTHER): Payer: Medicare Other | Admitting: Family Medicine

## 2019-03-10 ENCOUNTER — Encounter: Payer: Self-pay | Admitting: Family Medicine

## 2019-03-10 DIAGNOSIS — R634 Abnormal weight loss: Secondary | ICD-10-CM

## 2019-03-10 DIAGNOSIS — R112 Nausea with vomiting, unspecified: Secondary | ICD-10-CM | POA: Diagnosis not present

## 2019-03-10 MED ORDER — PANTOPRAZOLE SODIUM 40 MG PO TBEC: 40.0000 mg | DELAYED_RELEASE_TABLET | Freq: Every day | ORAL | 1 refills | Status: DC

## 2019-03-10 NOTE — Progress Notes (Signed)
Chief Complaint  Patient presents with  . Emesis     Subjective Reginald Dunn is a 38 y.o. male who presents with vomiting and nausea. Present w mother, [name]. Due to outbreak, we are interacting via web portal for an electronic face-to-face visit. I verified patient's ID using 2 identifiers.  Symptoms began 3 d ago. Patient has abdominal pain, 25 lb unintentional weight loss over past year (has been having intermittent bouts of this that spontaneously resolve), vomiting and nausea Patient denies cramping, diarrhea, fever, chills, headache, arthralgias and bleeding Evaluation to date: Has been on Zofran, Phenergan for nausea in past Not a/w meals. No travel of med changes. +hx of hiatal hernia, repaired in 2017. Father w recent dx of Shatzki's ring, but he denies trouble swallowing. Sick contacts: none known  Past Medical History:  Diagnosis Date  . ADHD   . Depression   . Social anxiety disorder    No Known Allergies  Review of Systems Constitutional:  No fevers or chills Ear/Nose/Mouth/Throat:  No red eyes Gastrointestinal:  As noted in the HPI Musculoskeletal/Extremities: no myalgias Integumentary (Skin/Breast): no rash  Exam No conversational dyspnea Age appropriate judgment and insight Nml affect and mood  Assessment and Plan  Unintentional weight loss - Plan: Ambulatory referral to Gastroenterology  Intractable vomiting with nausea, unspecified vomiting type - Plan: pantoprazole (PROTONIX) 40 MG tablet, Ambulatory referral to Gastroenterology  Given recurrence of this, unintentional wt loss, and nml CT, will refer to GI. Low yield for RUQ [place] given no a/w meals.  Trial PPI in meantime.  F/u with me prn for this issue. The patient and his mother voiced understanding and agreement to the plan.  Jilda Roche [place], DO 03/10/19  12:56 PM

## 2019-03-14 ENCOUNTER — Encounter: Payer: Self-pay | Admitting: Gastroenterology

## 2019-03-14 ENCOUNTER — Other Ambulatory Visit: Payer: Self-pay

## 2019-03-14 ENCOUNTER — Telehealth (INDEPENDENT_AMBULATORY_CARE_PROVIDER_SITE_OTHER): Payer: Medicare Other | Admitting: Gastroenterology

## 2019-03-14 DIAGNOSIS — F411 Generalized anxiety disorder: Secondary | ICD-10-CM | POA: Diagnosis not present

## 2019-03-14 DIAGNOSIS — R112 Nausea with vomiting, unspecified: Secondary | ICD-10-CM

## 2019-03-14 DIAGNOSIS — K449 Diaphragmatic hernia without obstruction or gangrene: Secondary | ICD-10-CM | POA: Diagnosis not present

## 2019-03-14 DIAGNOSIS — K219 Gastro-esophageal reflux disease without esophagitis: Secondary | ICD-10-CM

## 2019-03-14 NOTE — Progress Notes (Signed)
Chief Complaint: Nausea, vomiting, unintentional weight loss  Referring Provider:     Sharlene Dory, DO   HPI:    Due to current restrictions/limitations of in-office visits due to the COVID-19 pandemic, this scheduled clinical appointment was converted to a telehealth virtual consultation using Doximity.  -The patient did consent to this virtual visit and is aware of possible charges through their insurance for this visit.  -Names of all parties present: Reginald Dunn (patient), [name] (patient's mother), [name], DO, [facility] (physician) -Patient location: Home -Physician location: Office  Reginald Dunn is a 38 y.o. male referred to the [facility]ology Clinic for evaluation of nausea, vomiting <1 week and 25#unintentional weight loss over the past year.  He states that he has been having intermittent nausea/vomiting over the last year as well.  Has been treated with Zofran, Reglan, and Phenergan.  Has had epoisodic n/v x3 in 2020 thus far, starting in 11/2018, at which time he was evaluated in the ER. Exam with mild TTP in MEG.  Mild leukocytosis (11.3) otherwise normal CBC and CMP and lipase.  CT was normal (small hiatal hernia).  Discharged with Reglan. Sxs last 48-72 hours. No fever, but will have associated MEG pain. No hematemsis, melena, or hematochezia. No improvement with antiemetics, just more so symptoms run their course. Sxs resolve then back to baseline after about 3-4 days.  Remains asymptomatic in between these episodes.  Symptoms recurred in 01/2019 and again last week, starting 4/22, lasting 3 days, and now resolved.  He was evaluated by his PCM, Dr. Carmelia Roller, on 03/10/2019 for the symptoms.  Started on Protonix 40 mg daily with referral to GI.  Very rare marijuana use, approximately 1-2 times per month.  Rare alcohol.  Smokes 1 PPD. No hx of migraines.   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 EGD n/f HH per mother and lap HH repair with fundoplication (unsure if complete or partial) completed. Reflux sxs resolved and able to stop all antacids.  Of note, was unable to vomit postop until recently.  Has lost 30# unintentionally in the last year.  Denies night sweats, fevers.  No previous colonoscopy.  No known family history of GI malignancy, IBD, hepatobiliary disease.  Additionally, history of Generalized Anxiety Disorder and Schizoaffective disorder, treated with Xanax prn, Neurontin (n/v 8%), Paxil (nausea 17-26%, vomiting 2-3%), Seroquel (nausea 5-10%, vomiting 3-8%), Risperdal (nausea 3-16%, vomiting <4%). None of these medications are new.   Past medical history, past surgical history, social history, family history, medications, and allergies reviewed in the chart and with patient over Doximity.   Past Medical History:  Diagnosis Date  . ADHD   . Depression   . Social anxiety disorder      Past Surgical History:  Procedure Laterality Date  . HIATAL HERNIA REPAIR  08/2016   No family history on file. Social History   Tobacco Use  . Smoking status: Current Every Day Smoker    Packs/day: 1.00  . Smokeless tobacco: Never Used  Substance Use Topics  . Alcohol use: Yes    Comment: socially  . Drug use: Never   Current Outpatient Medications  Medication Sig Dispense Refill  . ALPRAZolam (XANAX) 1 MG tablet Take 1 tablet (1 mg total) by mouth 3 (three) times daily as needed for anxiety. 90 tablet 1  . gabapentin (NEURONTIN)  600 MG tablet 2 po q am, 1 q afternoon, 2 qhs. 150 tablet 1  . pantoprazole (PROTONIX) 40 MG tablet Take 1 tablet (40 mg total) by mouth daily. 30 tablet 1  . PARoxetine (PAXIL) 20 MG tablet Take 1 tablet (20 mg total) by mouth daily. 30 tablet 1  . promethazine (PHENERGAN) 25 MG tablet Take 1 tablet (25 mg total) by mouth every 6 (six) hours as needed for nausea or vomiting. (Patient not taking:  Reported on 03/03/2019) 30 tablet 0  . QUEtiapine (SEROQUEL) 400 MG tablet Take 2.5 tablets (1,000 mg total) by mouth at bedtime. Take 2 and 1/2 tablets by mouth daily 75 tablet 1  . risperiDONE (RISPERDAL) 1 MG tablet Take 5 tablets (5 mg total) by mouth at bedtime. Take 5 at night 150 tablet 1   No current facility-administered medications for this visit.    No Known Allergies   Review of Systems: All systems reviewed and negative except where noted in HPI.     Physical Exam:    Physical exam not completed due to the nature of this telehealth communication.  Patient was otherwise alert and oriented and well communicative.   ASSESSMENT AND PLAN;   1) Nausea/Vomiting: 38 year old male with relatively recent onset episodic nausea and nonbloody emesis.  Discussed the broad DDX for this to include PUD, gastritis, infectious gastroenteritis, medication side effect (has several medications with nausea/vomiting ADR, although none of these medications are new), Cyclic Vomiting Syndrome (does return to normal in between episodes), vascular, atypical intestinal migraine, atypical reflux (history of reflux with hiatal hernia repair/fundoplication; query disruption of wrap), etc and will evaluate as below: -Resume Protonix for now -EGD to evaluate for etiology along with assessment of wrap integrity when able to schedule pending current COVID-19 related restrictions - Okay to resume antiemetics as needed -To call me if symptoms recur, as this may necessitate expediting evaluation -If EGD unrevealing, may consider additional etiologies and further evaluation for possible CVS - ?  CTA  2) History of GERD 3) Previous Hiatal Hernia repair and Fundoplication - Evaluate hernia (noted on CT) and wrap integrity at time of EGD as above - Protonix recently prescribed for possible atypical reflux versus gastritis/PUD as etiology.  Okay to resume for now  4) Weight loss: -Evaluating as above -CT was  otherwise unrevealing - If EGD unrevealing and weight loss continues, may consider colonoscopy versus repeat cross-sectional imaging  5) History of Generalized Anxiety Disorder and Schizoaffective: None of these prescribed medications are new.  Did discuss the medication ADR of each of them with regards to his nausea/vomiting.   The indications, risks, and benefits of EGD were explained to the patient in detail. Risks include but are not limited to bleeding, perforation, adverse reaction to medications, and cardiopulmonary compromise. Sequelae include but are not limited to the possibility of surgery, hositalization, and mortality. The patient verbalized understanding and wished to proceed. All questions answered, referred to scheduler. Further recommendations pending results of the exam.     Shellia CleverlyVito V , DO, FACG  03/14/2019, 9:30 AM   Carmelia RollerWendling, Jilda RocheNicholas Paul*

## 2019-03-14 NOTE — Patient Instructions (Signed)
We will call you at a later date to schedule your EGD with dil with Dr. Barron Alvine.  Please call our office at [phone] to set up your 3 month follow up visit.  It was a pleasure to see you today!  Vito Cirigliano, D.O.

## 2019-04-04 ENCOUNTER — Encounter: Payer: Self-pay | Admitting: Gastroenterology

## 2019-04-12 ENCOUNTER — Other Ambulatory Visit: Payer: Self-pay

## 2019-04-12 ENCOUNTER — Ambulatory Visit (AMBULATORY_SURGERY_CENTER): Payer: Medicare Other | Admitting: *Deleted

## 2019-04-12 VITALS — Ht 71.0 in | Wt 165.0 lb

## 2019-04-12 DIAGNOSIS — Z87898 Personal history of other specified conditions: Secondary | ICD-10-CM

## 2019-04-12 NOTE — Progress Notes (Signed)

## 2019-04-13 ENCOUNTER — Other Ambulatory Visit: Payer: Self-pay

## 2019-04-13 ENCOUNTER — Ambulatory Visit: Payer: Medicare Other | Admitting: Psychiatry

## 2019-04-13 ENCOUNTER — Ambulatory Visit (INDEPENDENT_AMBULATORY_CARE_PROVIDER_SITE_OTHER): Payer: Medicare Other | Admitting: Physician Assistant

## 2019-04-13 ENCOUNTER — Encounter: Payer: Self-pay | Admitting: Physician Assistant

## 2019-04-13 DIAGNOSIS — F259 Schizoaffective disorder, unspecified: Secondary | ICD-10-CM

## 2019-04-13 DIAGNOSIS — F411 Generalized anxiety disorder: Secondary | ICD-10-CM

## 2019-04-13 MED ORDER — QUETIAPINE FUMARATE 400 MG PO TABS: 1000.0000 mg | ORAL_TABLET | Freq: Every day | ORAL | 2 refills | Status: DC

## 2019-04-13 MED ORDER — RISPERIDONE 1 MG PO TABS: 5.0000 mg | ORAL_TABLET | Freq: Every day | ORAL | 2 refills | Status: DC

## 2019-04-13 MED ORDER — ALPRAZOLAM 1 MG PO TABS: 1.0000 mg | ORAL_TABLET | Freq: Three times a day (TID) | ORAL | 2 refills | Status: DC | PRN

## 2019-04-13 MED ORDER — PAROXETINE HCL 20 MG PO TABS: 20.0000 mg | ORAL_TABLET | Freq: Every day | ORAL | 2 refills | Status: DC

## 2019-04-13 MED ORDER — GABAPENTIN 600 MG PO TABS: ORAL_TABLET | ORAL | 2 refills | Status: DC

## 2019-04-13 NOTE — Progress Notes (Signed)
[facility]  Patient ID: Reginald Dunn,  MRN: [id number]  PCP: Reginald Dunn, Reginald Paul, DO  Date of Evaluation: 04/13/2019 Time spent:15 minutes  Chief Complaint:  Chief Complaint    Anxiety; Follow-up     Virtual Visit via Telephone Note  I connected with patient by a video enabled telemedicine application or telephone, with their informed consent, and verified patient privacy and that I am speaking with the correct person using two identifiers.  [facility] am private, at [facility] and the patient is at home.   I discussed the limitations, risks, security and privacy concerns of performing an evaluation and management service by telephone and the availability of in person appointments. I also discussed with the patient that there may be a patient responsible charge related to this service. The patient expressed understanding and agreed to proceed.   I discussed the assessment and treatment plan with the patient. The patient was provided an opportunity to ask questions and all were answered. The patient agreed with the plan and demonstrated an understanding of the instructions.   The patient was advised to call back or seek an in-person evaluation if the symptoms worsen or if the condition fails to improve as anticipated.  I provided 15  minutes of non-face-to-face time during this encounter.  HISTORY/CURRENT STATUS: HPI for 6-week med check.  Mom, Reginald Dunn, is also on speaker phone.  At the last visit 03/03/2019, Reginald Dunn reported more anxiety.  We increased the gabapentin and he states he is about 25% better.  The anxiety is generalized with internal sense of unease.  He feels like something bad is going to happen almost all day.  He denies any panic attacks.  He does have racing thoughts.  Patient denies increased energy with decreased need for sleep, no increased talkativeness, no racing thoughts, no impulsivity or risky behaviors, no increased spending, no increased libido, no  grandiosity.  Denies hallucinations.  No increased irritability or anger outbursts.  Patient denies loss of interest in usual activities and is able to enjoy things.  Denies decreased energy or motivation.  Appetite has not changed.  No extreme sadness, tearfulness, or feelings of hopelessness.  Denies any changes in concentration, making decisions or remembering things.  Denies suicidal or homicidal thoughts.  Denies dizziness, syncope, seizures, numbness, tingling, tremor, tics, unsteady gait, slurred speech, confusion. Denies muscle or joint pain, stiffness, or dystonia.  Individual Medical History/ Review of Systems: Changes? :No    Past medications for mental health diagnoses include: Unknown  Allergies: Patient has no known allergies.  Current Medications:  Current Outpatient Medications:  .  ALPRAZolam (XANAX) 1 MG tablet, Take 1 tablet (1 mg total) by mouth 3 (three) times daily as needed for anxiety., Disp: 90 tablet, Rfl: 2 .  gabapentin (NEURONTIN) 600 MG tablet, 2 po q am, 2 q afternoon, 2 qhs., Disp: 180 tablet, Rfl: 2 .  Multiple Vitamins-Minerals (CENTRUM VITAMINTS PO), Take by mouth daily., Disp: , Rfl:  .  pantoprazole (PROTONIX) 20 MG tablet, Take 40 mg by mouth daily., Disp: , Rfl:  .  PARoxetine (PAXIL) 20 MG tablet, Take 1 tablet (20 mg total) by mouth daily., Disp: 30 tablet, Rfl: 2 .  QUEtiapine (SEROQUEL) 400 MG tablet, Take 2.5 tablets (1,000 mg total) by mouth at bedtime. Take 2 and 1/2 tablets by mouth daily, Disp: 75 tablet, Rfl: 2 .  risperiDONE (RISPERDAL) 1 MG tablet, Take 5 tablets (5 mg total) by mouth at bedtime. Take 5 at night, Disp: 150 tablet,  Rfl: 2 Medication Side Effects: none  Family Medical/ Social History: Changes? No  MENTAL HEALTH EXAM:  There were no vitals taken for this visit.There is no height or weight on file to calculate BMI.  General Appearance: unable to assess  Eye Contact:  unable to assess  Speech:  Clear and Coherent  Volume:   Normal  Mood:  Euthymic  Affect:  Unable to assess  Thought Process:  Goal Directed  Orientation:  Full (Time, Place, and Person)  Thought Content: Logical   Suicidal Thoughts:  No  Homicidal Thoughts:  No  Memory:  WNL  Judgement:  Good  Insight:  Good  Psychomotor Activity:  Unable to assess  Concentration:  Concentration: Good  Recall:  Good  Fund of Knowledge: Good  Language: Good  Assets:  Desire for Improvement  ADL's:  Intact  Cognition: WNL  Prognosis:  Good    DIAGNOSES:    ICD-10-CM   1. Generalized anxiety disorder F41.1 gabapentin (NEURONTIN) 600 MG tablet    ALPRAZolam (XANAX) 1 MG tablet    PARoxetine (PAXIL) 20 MG tablet    QUEtiapine (SEROQUEL) 400 MG tablet    risperiDONE (RISPERDAL) 1 MG tablet  2. Schizoaffective disorder, unspecified type (HCC) F25.9 QUEtiapine (SEROQUEL) 400 MG tablet    risperiDONE (RISPERDAL) 1 MG tablet    Receiving Psychotherapy: No    RECOMMENDATIONS:  Increase gabapentin to 600 mg 2 p.o. 3 times daily. Continue Xanax 1 mg 3 times daily as needed. Continue Paxil 20 mg daily. Continue Seroquel 1000 mg nightly. Continue Risperdal 5 mg nightly. We discussed the possibility of increasing the Paxil for the anxiety but I feel that since he has had some response with the increase of gabapentin that we can take it to a maximum dose and hopefully have much more improvement.  Patient and his mom agree. Return in 6 weeks.  Melony Overly, PA-C   This record has been created using [employer].  Chart creation errors have been sought, but may not always have been located and corrected. Such creation errors do not reflect on the standard of medical care.

## 2019-04-18 ENCOUNTER — Telehealth: Payer: Self-pay | Admitting: *Deleted

## 2019-04-18 NOTE — Telephone Encounter (Signed)
Covid-19 screening questions  Have you traveled in the last 14 days?no  If yes where?  Do you now or have you had a fever in the last 14 days?no  Do you have any respiratory symptoms of shortness of breath or cough now or in the last 14 days?no  Do you have any family members or close contacts with diagnosed or suspected Covid-19 in the past 14 days?no  Have you been tested for Covid-19 and found to be positive?no   Pt made aware of care partner policy. SM       

## 2019-04-20 ENCOUNTER — Other Ambulatory Visit: Payer: Self-pay

## 2019-04-20 ENCOUNTER — Ambulatory Visit (AMBULATORY_SURGERY_CENTER): Payer: Medicare Other | Admitting: Gastroenterology

## 2019-04-20 ENCOUNTER — Encounter: Payer: Self-pay | Admitting: Gastroenterology

## 2019-04-20 VITALS — BP 109/70 | HR 73 | Temp 97.7°F | Resp 15 | Ht 71.0 in | Wt 165.0 lb

## 2019-04-20 DIAGNOSIS — K2950 Unspecified chronic gastritis without bleeding: Secondary | ICD-10-CM | POA: Diagnosis not present

## 2019-04-20 DIAGNOSIS — K2970 Gastritis, unspecified, without bleeding: Secondary | ICD-10-CM | POA: Diagnosis not present

## 2019-04-20 DIAGNOSIS — K449 Diaphragmatic hernia without obstruction or gangrene: Secondary | ICD-10-CM

## 2019-04-20 DIAGNOSIS — Z87898 Personal history of other specified conditions: Secondary | ICD-10-CM

## 2019-04-20 DIAGNOSIS — Z9889 Other specified postprocedural states: Secondary | ICD-10-CM | POA: Diagnosis not present

## 2019-04-20 DIAGNOSIS — K219 Gastro-esophageal reflux disease without esophagitis: Secondary | ICD-10-CM

## 2019-04-20 DIAGNOSIS — R110 Nausea: Secondary | ICD-10-CM | POA: Diagnosis not present

## 2019-04-20 MED ORDER — SODIUM CHLORIDE 0.9 % IV SOLN: 500.0000 mL | Freq: Once | INTRAVENOUS | Status: DC

## 2019-04-20 NOTE — Patient Instructions (Signed)
YOU HAD AN ENDOSCOPIC PROCEDURE TODAY AT THE [facility]:   Refer to the procedure report that was given to you for any specific questions about what was found during the examination.  If the procedure report does not answer your questions, please call your gastroenterologist to clarify.  If you requested that your care partner not be given the details of your procedure findings, then the procedure report has been included in a sealed envelope for you to review at your convenience later.  YOU SHOULD EXPECT: Some feelings of bloating in the abdomen. Passage of more gas than usual.  Walking can help get rid of the air that was put into your GI tract during the procedure and reduce the bloating. If you had a lower endoscopy (such as a colonoscopy or flexible sigmoidoscopy) you may notice spotting of blood in your stool or on the toilet paper. If you underwent a bowel prep for your procedure, you may not have a normal bowel movement for a few days.  Please Note:  You might notice some irritation and congestion in your nose or some drainage.  This is from the oxygen used during your procedure.  There is no need for concern and it should clear up in a day or so.  SYMPTOMS TO REPORT IMMEDIATELY:   Following upper endoscopy (EGD)  Vomiting of blood or coffee ground material  New chest pain or pain under the shoulder blades  Painful or persistently difficult swallowing  New shortness of breath  Fever of 100F or higher  Black, tarry-looking stools  For urgent or emergent issues, a gastroenterologist can be reached at any hour by calling [phone].   DIET:  We do recommend a small meal at first, but then you may proceed to your regular diet.  Drink plenty of fluids but you should avoid alcoholic beverages for 24 hours.  ACTIVITY:  You should plan to take it easy for the rest of today and you should NOT DRIVE or use heavy machinery until tomorrow (because of the sedation medicines used  during the test).    FOLLOW UP: Our staff will call the number listed on your records 48-72 hours following your procedure to check on you and address any questions or concerns that you may have regarding the information given to you following your procedure. If we do not reach you, we will leave a message.  We will attempt to reach you two times.  During this call, we will ask if you have developed any symptoms of COVID 19. If you develop any symptoms (ie: fever, flu-like symptoms, shortness of breath, cough etc.) before then, please call [phone].  If you test positive for Covid 19 in the 2 weeks post procedure, please call and report this information to us.    If any biopsies were taken you will be contacted by phone or by letter within the next 1-3 weeks.  Please call us at [phone] if you have not heard about the biopsies in 3 weeks.    SIGNATURES/CONFIDENTIALITY: You and/or your care partner have signed paperwork which will be entered into your electronic medical record.  These signatures attest to the fact that that the information above on your After Visit Summary has been reviewed and is understood.  Full responsibility of the confidentiality of this discharge information lies with you and/or your care-partner. 

## 2019-04-20 NOTE — Progress Notes (Signed)
Pt's states no medical or surgical changes since previsit or office visit. 

## 2019-04-20 NOTE — Progress Notes (Signed)
Called to room to assist during endoscopic procedure.  Patient ID and intended procedure confirmed with present staff. Received instructions for my participation in the procedure from the performing physician.  

## 2019-04-20 NOTE — Op Note (Signed)
[facility] Patient Name: Reginald Dunn Procedure Date: 04/20/2019 9:04 AM MRN: [id number] Endoscopist: [name] , MD Age: 38 Referring MD:  Date of Birth: 10/01/1981 Gender: Male Account #: [id number] Procedure:                Upper GI endoscopy Indications:              Esophageal reflux, Nausea with vomiting, Weight loss Medicines:                Monitored Anesthesia Care Procedure:                Pre-Anesthesia Assessment:                           - Prior to the procedure, a History and Physical                            was performed, and patient medications and                            allergies were reviewed. The patient's tolerance of                            previous anesthesia was also reviewed. The risks                            and benefits of the procedure and the sedation                            options and risks were discussed with the patient.                            All questions were answered, and informed consent                            was obtained. Prior Anticoagulants: The patient has                            taken no previous anticoagulant or antiplatelet                            agents. ASA Grade Assessment: II - A patient with                            mild systemic disease. After reviewing the risks                            and benefits, the patient was deemed in                            satisfactory condition to undergo the procedure.                           After obtaining informed consent, the endoscope was  passed under direct vision. Throughout the                            procedure, the patient's blood pressure, pulse, and                            oxygen saturations were monitored continuously. The                            Endoscope was introduced through the mouth, and                            advanced to the second part of duodenum. The upper                            GI  endoscopy was accomplished without difficulty.                            The patient tolerated the procedure well. Scope In: Scope Out: Findings:                 Esophagogastric landmarks were identified: the                            gastroesophageal junction was found at 36 cm and                            the site of hiatal narrowing was found at 38 cm                            from the incisors.                           Two tongues of salmon-colored mucosa were present                            from 35 to 36 cm. No other visible abnormalities                            were present. Biopsies were taken with a cold                            forceps for histology. Estimated blood loss was                            minimal.                           The upper third of the esophagus and middle third                            of the esophagus were normal.                           A small amount  of food (residue) was found in the                            gastric body.                           Evidence of a Nissen fundoplication was found in                            the cardia. The wrap appeared loose. This was                            traversed. There was a small hernia noted on                            retroflexed views.                           Localized severely erythematous, moderately                            edematous mucosa without bleeding was found in the                            cardia. Biopsies were taken with a cold forceps for                            histology. Estimated blood loss was minimal.                           Normal mucosa was otherwise found in the gastric                            fundus, in the gastric body, at the incisura, in                            the gastric antrum and at the pylorus.                           The duodenal bulb, first portion of the duodenum                            and second portion of the duodenum were  normal. Complications:            No immediate complications. Estimated Blood Loss:     Estimated blood loss was minimal. Impression:               - Esophagogastric landmarks identified.                           - Salmon-colored mucosa. Biopsied.                           - Normal upper third of esophagus and middle third  of esophagus.                           - A small amount of food (residue) in the stomach.                           - A Nissen fundoplication was found. The wrap                            appears loose.                           - Erythematous mucosa in the cardia. Biopsied.                           - Normal mucosa was found in the gastric fundus, in                            the gastric body, in the incisura, in the antrum                            and in the pylorus.                           - Normal duodenal bulb, first portion of the                            duodenum and second portion of the duodenum. Recommendation:           - Patient has a contact number available for                            emergencies. The signs and symptoms of potential                            delayed complications were discussed with the                            patient. Return to normal activities tomorrow.                            Written discharge instructions were provided to the                            patient.                           - Resume previous diet today.                           - Continue present medications.                           - Await pathology results.                           -  Return to GI clinic in 1 month.                           - Do a gastric emptying study at appointment to be                            scheduled.                           - Depending on biopsy results and the results of                            the GES, can discuss options for repair of the                            loose  fundoplication wrap and hernia noted. [name], MD 04/20/2019 9:52:20 AM

## 2019-04-20 NOTE — Progress Notes (Signed)
A/ox3, pleased with MAC, report to RN 

## 2019-04-24 ENCOUNTER — Telehealth: Payer: Self-pay | Admitting: *Deleted

## 2019-04-24 NOTE — Telephone Encounter (Signed)
  Follow up Call-  Call back number 04/20/2019  Post procedure Call Back phone  # [phone]  Permission to leave phone message Yes     Patient questions:  VM box has not been set up yet.

## 2019-04-24 NOTE — Telephone Encounter (Signed)
  Follow up Call-  Call back number 04/20/2019  Post procedure Call Back phone  # [phone]  Permission to leave phone message Yes     Patient questions:  Do you have a fever, pain , or abdominal swelling? No. Pain Score  0 *  Have you tolerated food without any problems? Yes.    Have you been able to return to your normal activities? Yes.    Do you have any questions about your discharge instructions: Diet   No. Medications  No. Follow up visit  No.  Do you have questions or concerns about your Care? No.  Actions: * If pain score is 4 or above: No action needed, pain <4.  1. Have you developed a fever since your procedure? no  2.   Have you had an respiratory symptoms (SOB or cough) since your procedure? no  3.   Have you tested positive for COVID 19 since your procedure no  4.   Have you had any family members/close contacts diagnosed with the COVID 19 since your procedure? no   If yes to any of these questions please route to Joylene Zacari, RN and Alphonsa Gin, Therapist, sports.

## 2019-04-25 ENCOUNTER — Encounter: Payer: Self-pay | Admitting: Gastroenterology

## 2019-04-28 ENCOUNTER — Telehealth: Payer: Self-pay | Admitting: Gastroenterology

## 2019-04-28 ENCOUNTER — Telehealth (INDEPENDENT_AMBULATORY_CARE_PROVIDER_SITE_OTHER): Payer: Medicare Other | Admitting: Gastroenterology

## 2019-04-28 ENCOUNTER — Other Ambulatory Visit: Payer: Self-pay

## 2019-04-28 ENCOUNTER — Encounter: Payer: Self-pay | Admitting: Gastroenterology

## 2019-04-28 DIAGNOSIS — K449 Diaphragmatic hernia without obstruction or gangrene: Secondary | ICD-10-CM | POA: Diagnosis not present

## 2019-04-28 DIAGNOSIS — R634 Abnormal weight loss: Secondary | ICD-10-CM

## 2019-04-28 DIAGNOSIS — R112 Nausea with vomiting, unspecified: Secondary | ICD-10-CM | POA: Diagnosis not present

## 2019-04-28 DIAGNOSIS — K219 Gastro-esophageal reflux disease without esophagitis: Secondary | ICD-10-CM | POA: Diagnosis not present

## 2019-04-28 MED ORDER — PANTOPRAZOLE SODIUM 40 MG PO TBEC: 40.0000 mg | DELAYED_RELEASE_TABLET | Freq: Every day | ORAL | 1 refills | Status: DC

## 2019-04-28 MED ORDER — PANTOPRAZOLE SODIUM 40 MG PO TBEC: 40.0000 mg | DELAYED_RELEASE_TABLET | Freq: Two times a day (BID) | ORAL | 0 refills | Status: DC

## 2019-04-28 NOTE — Telephone Encounter (Signed)
Patient mother called in and stated that the patient is having a bad episode of vomiting bile,stomach pain,upper chest. She would like a call back for advice and possible a virtual visit today with the doctor. I have schedule patient for Monday.

## 2019-04-28 NOTE — Telephone Encounter (Signed)
Sure, I can see as a Virtual visit this afternoon. Thanks.

## 2019-04-28 NOTE — Telephone Encounter (Signed)
Patient's mom reports epigastric pain and radiating to his "sides and back".  Mom reports vomiting last pm from approximately 6 pm- about 5 am.  Mom reports that vomitus is all bile " did not vomit the spaghetti he ate at dinner". He is taking pantorazole. Mom reports that promethazine, metoclopramide, or zofran have helped with the vomiting in the past.  Vomiting stopped about 5 am , mom reports that he is still having the pain.  Mom is asking for a virtual visit with you today.  Please advise.

## 2019-04-28 NOTE — Telephone Encounter (Signed)
Patient put on today for 3:00 .  Mom notified

## 2019-04-28 NOTE — Patient Instructions (Addendum)
You have been scheduled for a gastric emptying scan at [facility] on 05/17/2019 at 7:30am. Please arrive at least 15 minutes prior to your appointment for registration. Please make certain not to have anything to eat or drink after midnight the night before your test. Hold all stomach medications (ex: Zofran, phenergan, Reglan) 48 hours prior to your test. If you need to reschedule your appointment, please contact radiology scheduling at [phone]. _____________________________________________________________________ A gastric-emptying study measures how long it takes for food to move through your stomach. There are several ways to measure stomach emptying. In the most common test, you eat food that contains a small amount of radioactive material. A scanner that detects the movement of the radioactive material is placed over your abdomen to monitor the rate at which food leaves your stomach. This test normally takes about 4 hours to complete. _____________________________________________________________________  Dennis Bast have been scheduled for a CT scan of the Head at [facility]. You are scheduled on 05/17/2019 at 11:30pm. You should arrive 15 minutes prior to your appointment time for registration. Please follow the written instructions below on the day of your exam:  WARNING: IF YOU ARE ALLERGIC TO IODINE/X-RAY DYE, PLEASE NOTIFY RADIOLOGY IMMEDIATELY AT [phone]! YOU WILL BE GIVEN A 13 HOUR PREMEDICATION PREP.  1) Do not eat or drink anything after midnight the night before your test.  It was a pleasure to see you today!  Reginald Dunn, D.O.

## 2019-04-28 NOTE — Progress Notes (Signed)
Chief Complaint: Nausea/vomiting  Referring Provider:     Shelda Pal, DO  GI history: 38 year old male initially seen by me in 02/2019 with intermittent nausea/vomiting and 30# unintentional weight loss over the past year.  Had been treated with Zofran, Reglan, and Phenergan.  Describes episodicn/v x3 in 2020 thus far, starting in 11/2018, at which time he was evaluated in the ER. Exam with mild TTP in MEG.  Mild leukocytosis (11.3) otherwise normal CBC and CMP and lipase.  CT was normal (small hiatal hernia).  Discharged with Reglan. Sxs last 48-72 hours, then resolve.  Asymptomatic in between episodes.  Symptoms recurred in 01/2019 and again in 02/2019, lasting 3 days each.  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 EGD n/f HH per mother and lap HH repair with fundoplication (unsure if complete or partial) completed. Reflux sxs resolved and able to stop all antacids.  Of note, was unable to vomit postop until recently.  Additionally, history of Generalized Anxiety Disorder and Schizoaffective disorder, treated with Xanax prn, Neurontin (n/v 8%), Paxil (nausea 17-26%, vomiting 2-3%), Seroquel (nausea 5-10%, vomiting 3-8%), Risperdal (nausea 3-16%, vomiting <4%). None of these medications are new.   Endoscopic History: -EGD (04/20/2019, Dr. Bryan Lemma): 2 tongues of salmon-colored mucosa, 1 cm (biopsies benign), loose Nissen fundoplication with small hiatal hernia, small amount of food residue in gastric body, severely erythematous and edematous mucosa in gastric cardia (biopsies negative for H. pylori), with otherwise normal stomach and duodenum.  Recommended GES.  HPI:    Due to current restrictions/limitations of in-office visits due to the COVID-19 pandemic, this scheduled clinical appointment was converted to a telehealth virtual consultation using [employer].  -Time of medical  discussion: 20 minutes -The patient did consent to this virtual visit and is aware of possible charges through their insurance for this visit.  -Names of all parties present: Reginald Dunn (patient), Lovette Cliche (patient's mother), Gerrit Heck, DO, [facility] (physician) -Patient location: Home -Physician location: Office  Reginald Dunn is a 38 y.o. male presenting to the Gastroenterology Clinic for follow-up and evaluation of nausea/vomiting which restarted last evening. Was seen for similar symptoms in 02/2019 as outlined above.  Treated with high-dose PPI and antiemetics at that time.  EGD last week demonstrated localized area of gastritis in gastric cardia, loose prior Nissen fundoplication, and histologic evidence of reflux esophagitis.  Patent pylorus.  Does have subcostal pain radiating around to the back, improved with back massage.  No reliable improvement in N/V with Phenergan, Zofran, Reglan that he has at home from prior ER evaluations.  Otherwise, no hematochezia, melena.  Was doing well otherwise prior to last evening.  He thinks his weight is stable, but has not been weighing at home.  Past medical history, past surgical history, social history, family history, medications, and allergies reviewed in the chart and with patient.    Past Medical History:  Diagnosis Date  . ADHD   . Allergy    seasonal  . Blood transfusion without reported diagnosis    2017  . Depression   . GERD (gastroesophageal reflux disease)   . Social anxiety disorder      Past Surgical History:  Procedure Laterality Date  . HIATAL HERNIA REPAIR  08/2016   Family History  Problem Relation Age of Onset  . Colon cancer Neg Hx   . Colon polyps  Neg Hx   . Esophageal cancer Neg Hx   . Pancreatic cancer Neg Hx   . Prostate cancer Neg Hx   . Rectal cancer Neg Hx    Social History   Tobacco Use  . Smoking status: Current Every Day Smoker    Packs/day: 1.00  . Smokeless tobacco: Never Used   Substance Use Topics  . Alcohol use: Yes    Comment: socially  . Drug use: Never   Current Outpatient Medications  Medication Sig Dispense Refill  . ALPRAZolam (XANAX) 1 MG tablet Take 1 tablet (1 mg total) by mouth 3 (three) times daily as needed for anxiety. (Patient taking differently: Take 1 mg by mouth 3 (three) times daily. ) 90 tablet 2  . FIBER ADULT GUMMIES PO Take 1 tablet by mouth 3 (three) times daily.    Marland Kitchen. gabapentin (NEURONTIN) 600 MG tablet 2 po q am, 2 q afternoon, 2 qhs. 180 tablet 2  . Multiple Vitamins-Minerals (CENTRUM VITAMINTS PO) Take by mouth daily.    . pantoprazole (PROTONIX) 20 MG tablet Take 40 mg by mouth daily.    Marland Kitchen. PARoxetine (PAXIL) 20 MG tablet Take 1 tablet (20 mg total) by mouth daily. 30 tablet 2  . QUEtiapine (SEROQUEL) 400 MG tablet Take 2.5 tablets (1,000 mg total) by mouth at bedtime. Take 2 and 1/2 tablets by mouth daily 75 tablet 2  . risperiDONE (RISPERDAL) 1 MG tablet Take 5 tablets (5 mg total) by mouth at bedtime. Take 5 at night 150 tablet 2   No current facility-administered medications for this visit.    No Known Allergies   Review of Systems: All systems reviewed and negative except where noted in HPI.     Physical Exam:    Complete physical exam not completed due to the nature of this telehealth communication.   Gen: Awake, alert, and oriented, and well communicative. HEENT: EOMI, non-icteric sclera, NCAT, MMM Neck: Normal movement of head and neck Pulm: No labored breathing, speaking in full sentences without conversational dyspnea GI: Nondistended, no tenderness to self palpation Derm: No apparent lesions or bruising in visible field MS: Moves all visible extremities without noticeable abnormality Psych: Pleasant, cooperative, normal speech, thought processing seemingly intact   ASSESSMENT AND PLAN;   1) Nausea/vomiting: 38 year old male with relatively recent onset, episodic nausea and nonbloody emesis.  EGD notable for  retained food in stomach.  Discussed DDX to include gastroparesis, CVS, vascular, atypical intestinal migraine, atypical reflux (loose fundoplication with 1 cm hiatal hernia).  - GES -CT head - If unrevealing, may consider CT angio -Antiemetics okay to continue -Low-fat, low fiber diet while symptomatic -RTC after studies complete -If unrevealing, consider additional etiologies for possible CVS  2) GERD 3) Hiatal hernia  - Increase Protonix back to 40 mg twice daily for now - Discussed loose fundoplication and a small 1 cm hernia noted on recent EGD.  Pending evaluation treatment of the above acute issues, can consider endoscopic repair in the future  4) Weight loss: Weight now stable, but overall down approximately 30#from baseline.  - Advised to do weight check at home several times per week -P.o. intake as tolerated - If weight loss continues, and pending above work-up, consider colonoscopy  5) Gastritis: Localized area of gastritis in the gastric cardia.  Biopsies otherwise benign.  Suspect this is due to nausea/vomiting rather than causative.  Treated with high-dose PPI as above for mucosal healing  6) History of Generalized Anxiety Disorder and Schizoaffective: None of these  prescribed medications are new.  Previously discussed the medication ADR of each of them with regards to his nausea/vomiting.  I spent a total of 15 minutes of face-to-face time with the patient. Greater than 50% of the time was spent counseling and coordinating care.     Shellia CleverlyVito V Countess Biebel, DO, FACG  04/28/2019, 2:52 PM   [place], Jilda RocheNicholas Paul*

## 2019-05-01 ENCOUNTER — Telehealth: Payer: Medicare Other | Admitting: Gastroenterology

## 2019-05-02 ENCOUNTER — Other Ambulatory Visit: Payer: Self-pay | Admitting: Physician Assistant

## 2019-05-02 DIAGNOSIS — F411 Generalized anxiety disorder: Secondary | ICD-10-CM

## 2019-05-09 ENCOUNTER — Telehealth: Payer: Self-pay | Admitting: *Deleted

## 2019-05-10 ENCOUNTER — Telehealth: Payer: Self-pay | Admitting: Gastroenterology

## 2019-05-10 NOTE — Telephone Encounter (Signed)
Pt had EGD 04/20/19 and mother stated that insurance requires that Dr. Bryan Lemma documents that the procedure is "medically necessary" in order for insurance to cover EGD.

## 2019-05-17 ENCOUNTER — Other Ambulatory Visit: Payer: Self-pay

## 2019-05-17 ENCOUNTER — Encounter (HOSPITAL_COMMUNITY): Payer: Self-pay

## 2019-05-17 ENCOUNTER — Ambulatory Visit (HOSPITAL_COMMUNITY)
Admission: RE | Admit: 2019-05-17 | Discharge: 2019-05-17 | Disposition: A | Discharge status: Home / Self Care | Payer: Medicare Other | Source: Ambulatory Visit | Attending: Gastroenterology | Admitting: Gastroenterology

## 2019-05-17 ENCOUNTER — Encounter (HOSPITAL_COMMUNITY)
Admission: RE | Admit: 2019-05-17 | Discharge: 2019-05-17 | Disposition: A | Discharge status: Home / Self Care | Payer: Medicare Other | Source: Ambulatory Visit | Attending: Gastroenterology | Admitting: Gastroenterology

## 2019-05-17 DIAGNOSIS — R634 Abnormal weight loss: Secondary | ICD-10-CM

## 2019-05-17 DIAGNOSIS — F259 Schizoaffective disorder, unspecified: Secondary | ICD-10-CM | POA: Diagnosis not present

## 2019-05-17 DIAGNOSIS — K2970 Gastritis, unspecified, without bleeding: Secondary | ICD-10-CM | POA: Diagnosis not present

## 2019-05-17 DIAGNOSIS — K449 Diaphragmatic hernia without obstruction or gangrene: Secondary | ICD-10-CM

## 2019-05-17 DIAGNOSIS — K219 Gastro-esophageal reflux disease without esophagitis: Secondary | ICD-10-CM | POA: Diagnosis not present

## 2019-05-17 DIAGNOSIS — K30 Functional dyspepsia: Secondary | ICD-10-CM | POA: Diagnosis not present

## 2019-05-17 DIAGNOSIS — F17210 Nicotine dependence, cigarettes, uncomplicated: Secondary | ICD-10-CM | POA: Diagnosis not present

## 2019-05-17 DIAGNOSIS — Z79899 Other long term (current) drug therapy: Secondary | ICD-10-CM | POA: Insufficient documentation

## 2019-05-17 DIAGNOSIS — R112 Nausea with vomiting, unspecified: Secondary | ICD-10-CM

## 2019-05-17 DIAGNOSIS — F411 Generalized anxiety disorder: Secondary | ICD-10-CM | POA: Diagnosis not present

## 2019-05-17 MED ORDER — SODIUM CHLORIDE (PF) 0.9 % IJ SOLN
INTRAMUSCULAR | Status: AC
  Filled 2019-05-17: qty 50

## 2019-05-17 MED ORDER — IOHEXOL 300 MG/ML  SOLN
75.0000 mL | Freq: Once | INTRAMUSCULAR | Status: AC | PRN
  Administered 2019-05-17: 75 mL via INTRAVENOUS

## 2019-05-17 MED ORDER — TECHNETIUM TC 99M SULFUR COLLOID
2.2000 | Freq: Once | INTRAVENOUS | Status: AC | PRN
  Administered 2019-05-17: 2.2 via INTRAVENOUS

## 2019-05-23 ENCOUNTER — Other Ambulatory Visit: Payer: Self-pay | Admitting: Gastroenterology

## 2019-05-23 DIAGNOSIS — K219 Gastro-esophageal reflux disease without esophagitis: Secondary | ICD-10-CM

## 2019-05-23 DIAGNOSIS — R634 Abnormal weight loss: Secondary | ICD-10-CM

## 2019-05-23 DIAGNOSIS — R112 Nausea with vomiting, unspecified: Secondary | ICD-10-CM

## 2019-05-23 DIAGNOSIS — K449 Diaphragmatic hernia without obstruction or gangrene: Secondary | ICD-10-CM

## 2019-05-24 ENCOUNTER — Telehealth: Payer: Self-pay | Admitting: Gastroenterology

## 2019-05-24 NOTE — Telephone Encounter (Signed)
Pt's mother calling on behalf of pt to inquiry about imitrex. She stated that pt's pharmacy has not received prescription for this medication. Pls send it again to [facility] on [address] in [place]

## 2019-05-25 ENCOUNTER — Ambulatory Visit: Payer: Medicare Other | Admitting: Physician Assistant

## 2019-05-25 MED ORDER — SUMATRIPTAN 20 MG/ACT NA SOLN: NASAL | 0 refills | Status: DC

## 2019-05-25 NOTE — Telephone Encounter (Signed)
I have sent medication to the pharmacy, patients mother ws notified. Also made patient follow up appointment.

## 2019-05-29 NOTE — Telephone Encounter (Signed)
Who does this need to go to?

## 2019-06-21 ENCOUNTER — Telehealth: Payer: Self-pay

## 2019-06-21 NOTE — Telephone Encounter (Signed)
Covid-19 screening questions   Do you now or have you had a fever in the last 14 days? No  Do you have any respiratory symptoms of shortness of breath or cough now or in the last 14 days? No  Do you have any family members or close contacts with diagnosed or suspected Covid-19 in the past 14 days? No  Have you been tested for Covid-19 and found to be positive? No    Patient said it is virutal ov

## 2019-06-22 ENCOUNTER — Other Ambulatory Visit: Payer: Self-pay

## 2019-06-22 ENCOUNTER — Telehealth (INDEPENDENT_AMBULATORY_CARE_PROVIDER_SITE_OTHER): Payer: Medicare Other | Admitting: Gastroenterology

## 2019-06-22 DIAGNOSIS — R1115 Cyclical vomiting syndrome unrelated to migraine: Secondary | ICD-10-CM | POA: Diagnosis not present

## 2019-06-22 DIAGNOSIS — Z9889 Other specified postprocedural states: Secondary | ICD-10-CM

## 2019-06-22 DIAGNOSIS — K219 Gastro-esophageal reflux disease without esophagitis: Secondary | ICD-10-CM

## 2019-06-22 DIAGNOSIS — K449 Diaphragmatic hernia without obstruction or gangrene: Secondary | ICD-10-CM

## 2019-06-22 DIAGNOSIS — F411 Generalized anxiety disorder: Secondary | ICD-10-CM | POA: Diagnosis not present

## 2019-06-22 DIAGNOSIS — R112 Nausea with vomiting, unspecified: Secondary | ICD-10-CM | POA: Diagnosis not present

## 2019-06-22 DIAGNOSIS — R634 Abnormal weight loss: Secondary | ICD-10-CM

## 2019-06-22 NOTE — Progress Notes (Signed)
Chief Complaint: Nausea/vomiting, possible Cyclic Vomiting Syndrome  Referring Provider:     Sharlene DoryWendling, Nicholas Paul, DO  GI history: 38 year old male initially seen by me in 02/2019 with intermittent nausea/vomiting and 30# unintentional weight loss over the past year.  Had been treated with Zofran, Reglan, and Phenergan.  Describes episodicn/v x3 in 2020 thus far,starting in 11/2018, at which time he was evaluated in the ER.Exam with mild TTP in MEG. Mild leukocytosis (11.3) otherwise normal CBC and CMP and lipase. CT was normal (small hiatal hernia). Discharged with Reglan. Sxs last 48-72 hours, then resolve.  Asymptomatic in between episodes.  Symptoms recurred in 01/2019 and again in 02/2019, lasting 3 days each.  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 EGD n/fHHper motherandlapHH repair with fundoplication (unsure if complete or partial) completed. Reflux sxs resolved andable to stop all antacids. Of note, was unable to vomit postop until recently.  No reliable improvement in N/V with Phenergan, Zofran, Reglan.   Additionally, history of Generalized Anxiety Disorder and Schizoaffective disorder, treated with Xanaxprn, Neurontin(n/v 8%), Paxil(nausea 17-26%, vomiting 2-3%), Seroquel(nausea 5-10%, vomiting 3-8%), Risperdal(nausea 3-16%, vomiting <4%).None of these medications are new.  Endoscopic History: -EGD (04/20/2019, Dr. Barron Alvineirigliano): 2 tongues of salmon-colored mucosa, 1 cm (biopsies benign), loose Nissen fundoplication with small hiatal hernia, small amount of food residue in gastric body, severely erythematous and edematous mucosa in gastric cardia (biopsies negative for H. pylori), with otherwise normal stomach and duodenum.  Recommended GES.  -GES (05/2019): Mildly delayed emptying, with 66% and 73% emptying at 3 and 4 hours -CT head (05/2019): Normal   HPI:     Due to current restrictions/limitations of in-office visits due to the COVID-19 pandemic, this scheduled clinical appointment was converted to a telehealth consultation via telephone.  -Time of medical discussion: 20 minutes -The patient did consent to this telephone visit and is aware of possible charges through their insurance for this visit.  -Names of all parties present: Reginald Dunn (patient), Angelique Blonderenise (patient's mother), Doristine LocksVito Trevis Eden, DO, [facility] (physician)  Reginald RosenthalJohn W Krasowski is a 38 y.o. male referred to the [facility] for routine follow-up.  Last seen by me in 04/2019 for ongoing cyclic nausea/vomiting.  CT head normal.  GES mildly abnormal with mildly delayed emptying in third and fourth hours.  Prescribed trial of Imitrex for possible Cyclic Vomiting Syndrome to take prn, [facility].  Today, he states he hasn't had any n/v since the GES, so hasn't had any need to trial the Imitrex. Has gained 5# with improved p.o. intake.  Still no reflux symptoms, and continues to take the Protonix 40 mg bid as previously prescribed.  Otherwise, without any complaints today.  Past medical history, past surgical history, social history, family history, medications, and allergies reviewed in the chart and with patient over the phone.  Past Medical History:  Diagnosis Date  . ADHD   . Allergy    seasonal  . Blood transfusion without reported diagnosis    2017  . Depression   . GERD (gastroesophageal reflux disease)   . Social anxiety disorder      Past Surgical History:  Procedure Laterality Date  . HIATAL HERNIA REPAIR  08/2016   Family History  Problem Relation Age of Onset  . Colon cancer Neg Hx   . Colon polyps Neg Hx   .  Esophageal cancer Neg Hx   . Pancreatic cancer Neg Hx   . Prostate cancer Neg Hx   . Rectal cancer Neg Hx    Social History   Tobacco Use  . Smoking status: Current Every Day Smoker    Packs/day: 1.00  .  Smokeless tobacco: Never Used  Substance Use Topics  . Alcohol use: Yes    Comment: socially  . Drug use: Never   Current Outpatient Medications  Medication Sig Dispense Refill  . ALPRAZolam (XANAX) 1 MG tablet Take 1 tablet (1 mg total) by mouth 3 (three) times daily as needed for anxiety. (Patient taking differently: Take 1 mg by mouth 3 (three) times daily. ) 90 tablet 2  . gabapentin (NEURONTIN) 600 MG tablet 2 po q am, 2 q afternoon, 2 qhs. (Patient taking differently: 2 po q am, 2 q afternoon, 2 qhs) 180 tablet 2  . Multiple Vitamins-Minerals (CENTRUM VITAMINTS PO) Take by mouth daily.    . pantoprazole (PROTONIX) 40 MG tablet Take 1 tablet (40 mg total) by mouth 2 (two) times daily for 28 days. 56 tablet 0  . pantoprazole (PROTONIX) 40 MG tablet Take 1 tablet (40 mg total) by mouth daily. (Patient not taking: Reported on 06/21/2019) 180 tablet 1  . PARoxetine (PAXIL) 20 MG tablet Take 1 tablet (20 mg total) by mouth daily. 30 tablet 2  . QUEtiapine (SEROQUEL) 400 MG tablet Take 2.5 tablets (1,000 mg total) by mouth at bedtime. Take 2 and 1/2 tablets by mouth daily 75 tablet 2  . risperiDONE (RISPERDAL) 1 MG tablet Take 5 tablets (5 mg total) by mouth at bedtime. Take 5 at night 150 tablet 2  . SUMAtriptan (IMITREX) 20 MG/ACT nasal spray Spray once with onset of symptoms can use a second dose after 2 hours if ineffective. (Patient not taking: Reported on 06/21/2019) 1 Inhaler 0   No current facility-administered medications for this visit.    No Known Allergies   Review of Systems: All systems reviewed and negative except where noted in HPI.     Physical Exam:    Physical exam not completed due to the nature of this telehealth communication.  Patient was otherwise alert and oriented and well communicative.   ASSESSMENT AND PLAN;   1) Nausea/vomiting: 2) Cyclic Vomiting Syndrome 38 year old male with relatively recent onset, episodic nausea and nonbloody emesis, seemingly  consistent with CVS.  EGD notable for retained food in stomach and subsequent GES with mildly delayed emptying in the third and fourth hours.  Was given trial of Imitrex, but has since had no nausea/vomiting.  -Resume current p.o. intake -If recurrence of symptoms, to trial Imitrex as prescribed -Low-fat, low fiber diet while symptomatic  3) GERD 4) Hiatal hernia -  Reflux symptoms well controlled on Protonix 40 mg bid.  Reducing to 40 mg/day for the next 2 weeks, and if still well controlled, can continue to titrate down to 20 mg/day - Discussed loose fundoplication and a small 1 cm hernia noted on recent EGD.  Pending evaluation treatment of the above acute issues, can consider endoscopic repair in the future vs ongoing medical management for reflux  5) Weight loss: Has increased 5#over the last month with improved p.o. intake, now approximately 25#down from baseline -  Continue weight check at home  -P.o. intake as tolerated  6) Gastritis: Localized area of gastritis in the gastric cardia.  Biopsies otherwise benign.  Suspect this is due to nausea/vomiting rather than causative.  Treated with high-dose PPI  as above for mucosal healing  7) History of Generalized Anxiety Disorder and Schizoaffective: None of these prescribed medications are new. Previously discussed the medication ADR of each of them with regards to his nausea/vomiting.  RTC in 3 to 4 months or sooner as needed   Shellia CleverlyVito V Ananya Mccleese, DO, FACG  06/22/2019, 9:28 AM   Carmelia Dunn, Reginald RocheNicholas Paul*

## 2019-06-26 ENCOUNTER — Other Ambulatory Visit: Payer: Self-pay

## 2019-06-26 ENCOUNTER — Telehealth: Payer: Self-pay | Admitting: Physician Assistant

## 2019-06-26 ENCOUNTER — Ambulatory Visit: Payer: Medicare Other | Admitting: Physician Assistant

## 2019-06-26 DIAGNOSIS — F259 Schizoaffective disorder, unspecified: Secondary | ICD-10-CM

## 2019-06-26 DIAGNOSIS — F411 Generalized anxiety disorder: Secondary | ICD-10-CM

## 2019-06-26 MED ORDER — GABAPENTIN 600 MG PO TABS: ORAL_TABLET | ORAL | 2 refills | Status: DC

## 2019-06-26 MED ORDER — QUETIAPINE FUMARATE 400 MG PO TABS: 1000.0000 mg | ORAL_TABLET | Freq: Every day | ORAL | 2 refills | Status: DC

## 2019-06-26 MED ORDER — ALPRAZOLAM 1 MG PO TABS: 1.0000 mg | ORAL_TABLET | Freq: Three times a day (TID) | ORAL | 2 refills | Status: DC | PRN

## 2019-06-26 MED ORDER — PAROXETINE HCL 20 MG PO TABS: 20.0000 mg | ORAL_TABLET | Freq: Every day | ORAL | 2 refills | Status: DC

## 2019-06-26 MED ORDER — RISPERIDONE 1 MG PO TABS: 5.0000 mg | ORAL_TABLET | Freq: Every day | ORAL | 2 refills | Status: DC

## 2019-06-26 NOTE — Telephone Encounter (Signed)
We had to [place] pt today with TH, will need refills on all of his psychiatric meds listed. To [facility] [address], [place]. Thanks

## 2019-06-26 NOTE — Telephone Encounter (Signed)
Refills sent

## 2019-07-26 ENCOUNTER — Other Ambulatory Visit: Payer: Self-pay | Admitting: Gastroenterology

## 2019-07-26 DIAGNOSIS — R634 Abnormal weight loss: Secondary | ICD-10-CM

## 2019-07-26 DIAGNOSIS — R112 Nausea with vomiting, unspecified: Secondary | ICD-10-CM

## 2019-07-26 DIAGNOSIS — K449 Diaphragmatic hernia without obstruction or gangrene: Secondary | ICD-10-CM

## 2019-07-26 DIAGNOSIS — K219 Gastro-esophageal reflux disease without esophagitis: Secondary | ICD-10-CM

## 2019-08-02 DIAGNOSIS — K219 Gastro-esophageal reflux disease without esophagitis: Secondary | ICD-10-CM

## 2019-08-02 DIAGNOSIS — R112 Nausea with vomiting, unspecified: Secondary | ICD-10-CM

## 2019-08-02 DIAGNOSIS — R1115 Cyclical vomiting syndrome unrelated to migraine: Secondary | ICD-10-CM

## 2019-08-02 DIAGNOSIS — K449 Diaphragmatic hernia without obstruction or gangrene: Secondary | ICD-10-CM

## 2019-08-02 DIAGNOSIS — Z9889 Other specified postprocedural states: Secondary | ICD-10-CM

## 2019-08-02 DIAGNOSIS — R634 Abnormal weight loss: Secondary | ICD-10-CM

## 2019-08-02 MED ORDER — APREPITANT 80 & 125 MG PO CAPS: ORAL_CAPSULE | ORAL | 0 refills | Status: AC

## 2019-08-02 MED ORDER — PANTOPRAZOLE SODIUM 40 MG PO TBEC: 40.0000 mg | DELAYED_RELEASE_TABLET | Freq: Two times a day (BID) | ORAL | 3 refills | Status: DC

## 2019-08-02 NOTE — Telephone Encounter (Signed)
Called and spoke with patient's mom-verified DPR-she was informed of MD recommendations and is wanting to have the RX sent to the pharmacy to try the medication first; patinet's mom also reports she does not think the patient will go to see Dr. Derrill Kay at [facility] NOT placed at this time; patient's mom also states she does not think the patient will be willing to be seen by an APP-she would like to wait and schedule an appt with Dr. Baker Janus on his return to the office;   Please advise if further actions are needed;

## 2019-08-02 NOTE — Telephone Encounter (Signed)
Protonix 40 mg BID #30, 3RF RX sent in for patient-

## 2019-08-03 ENCOUNTER — Telehealth: Payer: Self-pay | Admitting: Gastroenterology

## 2019-08-03 MED ORDER — PANTOPRAZOLE SODIUM 40 MG PO TBEC: 40.0000 mg | DELAYED_RELEASE_TABLET | Freq: Two times a day (BID) | ORAL | 5 refills | Status: DC

## 2019-08-03 NOTE — Telephone Encounter (Signed)
RX sent in per MD request;

## 2019-08-03 NOTE — Telephone Encounter (Signed)
Prior authorization needs to be done for this patient states it expires in 30 minutes and once [place] expires it will have to go through appeals. Call back # [phone] Opt 3 to give clinical information.

## 2019-08-03 NOTE — Telephone Encounter (Signed)
PA has been faxed to [employer]. Patient will be contacted when we receive an approval or denial letter.

## 2019-08-07 ENCOUNTER — Ambulatory Visit: Payer: Medicare Other | Admitting: Physician Assistant

## 2019-09-04 ENCOUNTER — Encounter: Payer: Self-pay | Admitting: Physician Assistant

## 2019-09-04 ENCOUNTER — Other Ambulatory Visit: Payer: Self-pay

## 2019-09-04 ENCOUNTER — Ambulatory Visit (INDEPENDENT_AMBULATORY_CARE_PROVIDER_SITE_OTHER): Payer: Medicare Other | Admitting: Physician Assistant

## 2019-09-04 DIAGNOSIS — F259 Schizoaffective disorder, unspecified: Secondary | ICD-10-CM

## 2019-09-04 DIAGNOSIS — F411 Generalized anxiety disorder: Secondary | ICD-10-CM

## 2019-09-04 MED ORDER — GABAPENTIN 600 MG PO TABS: ORAL_TABLET | ORAL | 2 refills | Status: DC

## 2019-09-04 MED ORDER — ALPRAZOLAM 1 MG PO TABS: 1.0000 mg | ORAL_TABLET | Freq: Three times a day (TID) | ORAL | 2 refills | Status: DC | PRN

## 2019-09-04 MED ORDER — PAROXETINE HCL 20 MG PO TABS: 20.0000 mg | ORAL_TABLET | Freq: Every day | ORAL | 2 refills | Status: DC

## 2019-09-04 MED ORDER — QUETIAPINE FUMARATE 400 MG PO TABS: 1000.0000 mg | ORAL_TABLET | Freq: Every day | ORAL | 2 refills | Status: DC

## 2019-09-04 MED ORDER — RISPERIDONE 1 MG PO TABS: 5.0000 mg | ORAL_TABLET | Freq: Every day | ORAL | 2 refills | Status: DC

## 2019-09-04 NOTE — Progress Notes (Signed)
[facility]  Patient ID: Reginald Dunn,  MRN: [id number]  PCP: Shelda Pal, DO  Date of Evaluation: 09/04/2019 Time spent:15 minutes  Chief Complaint:  Chief Complaint    Follow-up     Virtual Visit via Telephone Note  I connected with patient by a video enabled telemedicine application or telephone, with their informed consent, and verified patient privacy and that I am speaking with the correct person using two identifiers.  I am private, in my office and the patient is home.  I discussed the limitations, risks, security and privacy concerns of performing an evaluation and management service by telephone and the availability of in person appointments. I also discussed with the patient that there may be a patient responsible charge related to this service. The patient expressed understanding and agreed to proceed.   I discussed the assessment and treatment plan with the patient. The patient was provided an opportunity to ask questions and all were answered. The patient agreed with the plan and demonstrated an understanding of the instructions.   The patient was advised to call back or seek an in-person evaluation if the symptoms worsen or if the condition fails to improve as anticipated.  I provided 15 minutes of non-face-to-face time during this encounter.  HISTORY/CURRENT STATUS: HPI For routine med check.  Mom is also on the phone.  Pt and mom state he's doing better.  He still has anxiety, especially around people.  Even w/ family members.  He will stay in his room sometimes and not socialize with them.  [place] and his mom state this works fine, his family members understand and it is not a problem.  His mood has been good most of the time.  He is able to enjoy things, especially sports on the weekends.  Energy and motivation are okay.  He does help do yard work sometimes for his family but otherwise he does not do anything.  He denies suicidal or  homicidal thoughts.  Denies increased energy with decreased need for sleep.  No risky or impulsive behaviors.  No increased libido or spending.  He denies hallucinations.  No grandiosity.  Denies dizziness, syncope, seizures, numbness, tingling, tremor, tics, unsteady gait, slurred speech, confusion. Denies muscle or joint pain, stiffness, or dystonia.  Individual Medical History/ Review of Systems: Changes? :No   Allergies: Patient has no known allergies.  Current Medications:  Current Outpatient Medications:  .  ALPRAZolam (XANAX) 1 MG tablet, Take 1 tablet (1 mg total) by mouth 3 (three) times daily as needed for anxiety., Disp: 90 tablet, Rfl: 2 .  gabapentin (NEURONTIN) 600 MG tablet, 2 po q am, 2 q afternoon, 2 qhs., Disp: 180 tablet, Rfl: 2 .  Multiple Vitamins-Minerals (CENTRUM VITAMINTS PO), Take by mouth daily., Disp: , Rfl:  .  pantoprazole (PROTONIX) 40 MG tablet, Take 1 tablet (40 mg total) by mouth 2 (two) times daily., Disp: 180 tablet, Rfl: 5 .  PARoxetine (PAXIL) 20 MG tablet, Take 1 tablet (20 mg total) by mouth daily., Disp: 30 tablet, Rfl: 2 .  QUEtiapine (SEROQUEL) 400 MG tablet, Take 2.5 tablets (1,000 mg total) by mouth at bedtime. Take 2 and 1/2 tablets by mouth daily, Disp: 75 tablet, Rfl: 2 .  risperiDONE (RISPERDAL) 1 MG tablet, Take 5 tablets (5 mg total) by mouth at bedtime. Take 5 at night, Disp: 150 tablet, Rfl: 2 .  SUMAtriptan (IMITREX) 20 MG/ACT nasal spray, Spray once with onset of symptoms can use a second dose after  2 hours if ineffective. (Patient not taking: Reported on 06/21/2019), Disp: 1 Inhaler, Rfl: 0 Medication Side Effects: none  Family Medical/ Social History: Changes? No  MENTAL HEALTH EXAM:  There were no vitals taken for this visit.There is no height or weight on file to calculate BMI.  General Appearance: Unable to assess  Eye Contact:  Unable to assess  Speech:  Clear and Coherent  Volume:  Normal  Mood:  Euthymic  Affect:  Unable to  assess  Thought Process:  Goal Directed  Orientation:  Full (Time, Place, and Person)  Thought Content: Logical   Suicidal Thoughts:  No  Homicidal Thoughts:  No  Memory:  WNL  Judgement:  Good  Insight:  Good  Psychomotor Activity:  Unable to assess  Concentration:  Concentration: Good  Recall:  Good  Fund of Knowledge: Good  Language: Good  Assets:  Desire for Improvement  ADL's:  Intact  Cognition: WNL  Prognosis:  Good    DIAGNOSES:    ICD-10-CM   1. Generalized anxiety disorder  F41.1 ALPRAZolam (XANAX) 1 MG tablet    PARoxetine (PAXIL) 20 MG tablet    QUEtiapine (SEROQUEL) 400 MG tablet    risperiDONE (RISPERDAL) 1 MG tablet    gabapentin (NEURONTIN) 600 MG tablet  2. Schizoaffective disorder, unspecified type (HCC)  F25.9 QUEtiapine (SEROQUEL) 400 MG tablet    risperiDONE (RISPERDAL) 1 MG tablet    Receiving Psychotherapy: No    RECOMMENDATIONS:  PDMP reviewed. Continue Xanax 1 mg 3 times daily as needed. Continue gabapentin 600 mg, 2 p.o. 3 times daily. Continue Paxil 20 mg daily. Continue Seroquel 400 mg, 2.5 pills nightly. Continue Risperdal 1 mg, 5 p.o. nightly. Return in 3 months.  Melony Overly, PA-C

## 2019-09-16 ENCOUNTER — Other Ambulatory Visit: Payer: Self-pay | Admitting: Gastroenterology

## 2019-09-16 DIAGNOSIS — R634 Abnormal weight loss: Secondary | ICD-10-CM

## 2019-09-16 DIAGNOSIS — K219 Gastro-esophageal reflux disease without esophagitis: Secondary | ICD-10-CM

## 2019-09-16 DIAGNOSIS — R112 Nausea with vomiting, unspecified: Secondary | ICD-10-CM

## 2019-09-16 DIAGNOSIS — K449 Diaphragmatic hernia without obstruction or gangrene: Secondary | ICD-10-CM

## 2019-10-02 ENCOUNTER — Telehealth: Payer: Self-pay

## 2019-10-02 NOTE — Telephone Encounter (Signed)
Prior authorization submitted and approved for QUETIAPINE 400 MG 2.5 TABLETS DAILY, effective 10/02/2019-11/15/2020 Patient has been on medication since 05/14/2017.  With [employer], WG#[id number] [facility] [place] aware of approval.

## 2019-12-05 ENCOUNTER — Ambulatory Visit (INDEPENDENT_AMBULATORY_CARE_PROVIDER_SITE_OTHER): Payer: Medicare Other | Admitting: Physician Assistant

## 2019-12-05 ENCOUNTER — Encounter: Payer: Self-pay | Admitting: Physician Assistant

## 2019-12-05 DIAGNOSIS — F259 Schizoaffective disorder, unspecified: Secondary | ICD-10-CM

## 2019-12-05 DIAGNOSIS — F411 Generalized anxiety disorder: Secondary | ICD-10-CM | POA: Diagnosis not present

## 2019-12-05 DIAGNOSIS — F4010 Social phobia, unspecified: Secondary | ICD-10-CM | POA: Diagnosis not present

## 2019-12-05 MED ORDER — GABAPENTIN 600 MG PO TABS: ORAL_TABLET | ORAL | 2 refills | Status: DC

## 2019-12-05 MED ORDER — ALPRAZOLAM 1 MG PO TABS: 1.0000 mg | ORAL_TABLET | Freq: Three times a day (TID) | ORAL | 2 refills | Status: DC | PRN

## 2019-12-05 MED ORDER — PAROXETINE HCL 20 MG PO TABS: 20.0000 mg | ORAL_TABLET | Freq: Every day | ORAL | 2 refills | Status: DC

## 2019-12-05 MED ORDER — RISPERIDONE 1 MG PO TABS: 5.0000 mg | ORAL_TABLET | Freq: Every day | ORAL | 2 refills | Status: DC

## 2019-12-05 MED ORDER — QUETIAPINE FUMARATE 400 MG PO TABS: 1000.0000 mg | ORAL_TABLET | Freq: Every day | ORAL | 2 refills | Status: DC

## 2019-12-05 NOTE — Progress Notes (Signed)
[facility]  Patient ID: Reginald Dunn,  MRN: [id number]  PCP: Shelda Pal, DO  Date of Evaluation: 12/05/2019 Time spent:20 minutes  Chief Complaint:  Chief Complaint    Follow-up     Virtual Visit via Telephone Note  I connected with patient by a video enabled telemedicine application or telephone, with their informed consent, and verified patient privacy and that I am speaking with the correct person using two identifiers.  I am private, in my office and the patient is home.  I discussed the limitations, risks, security and privacy concerns of performing an evaluation and management service by telephone and the availability of in person appointments. I also discussed with the patient that there may be a patient responsible charge related to this service. The patient expressed understanding and agreed to proceed.   I discussed the assessment and treatment plan with the patient. The patient was provided an opportunity to ask questions and all were answered. The patient agreed with the plan and demonstrated an understanding of the instructions.   The patient was advised to call back or seek an in-person evaluation if the symptoms worsen or if the condition fails to improve as anticipated.  I provided 20 minutes of non-face-to-face time during this encounter.  HISTORY/CURRENT STATUS: HPI For routine med check.  Mom is also on the phone.  Pt and mom state he's stable.  He had one episode in December where he stayed awake for 2 days, playing video games.  At that time, he did not have impulsivity or risky behavior.  No increased spending or libido.  No grandiosity.  No hallucinations.  "It has been a long time since anything like that happened.  And it has not happened since."  Denies hallucinations.  He still has anxiety, especially around people.  Even w/ family members.  He will stay in his room sometimes and not socialize with them.  [place] and his mom state  this works fine, his family members understand and it is not a problem.  The Xanax does help.  Since the pandemic, he has been fine staying at home.  His mood has been good most of the time.  He is able to enjoy things, especially sports on the weekends.  Energy and motivation are okay.  He does help do yard work sometimes for his family but otherwise he does not do anything.  He denies suicidal or homicidal thoughts.  Denies dizziness, syncope, seizures, numbness, tingling, tremor, tics, unsteady gait, slurred speech, confusion. Denies muscle or joint pain, stiffness, or dystonia.  Individual Medical History/ Review of Systems: Changes? :No    Past medications for mental health diagnoses include: Pristiq, Celexa, lithium, Depakote, carbamazepine, Ativan, Ambien, gabapentin, Ritalin, Vyvanse, Zoloft, Wellbutrin, Latuda, Seroquel, Zyprexa, Risperdal, Lamictal, Vistaril, BuSpar, clozapine, Klonopin, Paxil  Allergies: Patient has no known allergies.  Current Medications:  Current Outpatient Medications:  .  ALPRAZolam (XANAX) 1 MG tablet, Take 1 tablet (1 mg total) by mouth 3 (three) times daily as needed for anxiety., Disp: 90 tablet, Rfl: 2 .  gabapentin (NEURONTIN) 600 MG tablet, 2 po q am, 2 q afternoon, 2 qhs., Disp: 180 tablet, Rfl: 2 .  Multiple Vitamins-Minerals (CENTRUM VITAMINTS PO), Take by mouth daily., Disp: , Rfl:  .  PARoxetine (PAXIL) 20 MG tablet, Take 1 tablet (20 mg total) by mouth daily., Disp: 30 tablet, Rfl: 2 .  QUEtiapine (SEROQUEL) 400 MG tablet, Take 2.5 tablets (1,000 mg total) by mouth at bedtime., Disp: 75  tablet, Rfl: 2 .  risperiDONE (RISPERDAL) 1 MG tablet, Take 5 tablets (5 mg total) by mouth at bedtime. Take 5 at night, Disp: 150 tablet, Rfl: 2 .  pantoprazole (PROTONIX) 40 MG tablet, Take 1 tablet (40 mg total) by mouth 2 (two) times daily. (Patient not taking: Reported on 12/05/2019), Disp: 180 tablet, Rfl: 5 .  SUMAtriptan (IMITREX) 20 MG/ACT nasal spray, Spray  once with onset of symptoms can use a second dose after 2 hours if ineffective. (Patient not taking: Reported on 06/21/2019), Disp: 1 Inhaler, Rfl: 0 Medication Side Effects: none  Family Medical/ Social History: Changes? No  MENTAL HEALTH EXAM:  There were no vitals taken for this visit.There is no height or weight on file to calculate BMI.  General Appearance: Unable to assess  Eye Contact:  Unable to assess  Speech:  Clear and Coherent  Volume:  Normal  Mood:  Euthymic  Affect:  Unable to assess  Thought Process:  Goal Directed and Descriptions of Associations: Intact  Orientation:  Full (Time, Place, and Person)  Thought Content: Logical   Suicidal Thoughts:  No  Homicidal Thoughts:  No  Memory:  WNL  Judgement:  Good  Insight:  Good  Psychomotor Activity:  Unable to assess  Concentration:  Concentration: Good  Recall:  Good  Fund of Knowledge: Good  Language: Good  Assets:  Desire for Improvement  ADL's:  Intact  Cognition: WNL  Prognosis:  Good    DIAGNOSES:    ICD-10-CM   1. Schizoaffective disorder, unspecified type (HCC)  F25.9 QUEtiapine (SEROQUEL) 400 MG tablet    risperiDONE (RISPERDAL) 1 MG tablet  2. Generalized anxiety disorder  F41.1 ALPRAZolam (XANAX) 1 MG tablet    gabapentin (NEURONTIN) 600 MG tablet    PARoxetine (PAXIL) 20 MG tablet    QUEtiapine (SEROQUEL) 400 MG tablet    risperiDONE (RISPERDAL) 1 MG tablet  3. Social anxiety disorder  F40.10     Receiving Psychotherapy: No    RECOMMENDATIONS:  PDMP reviewed. We discussed the fact that we could increase the Risperdal but none of [place] want to do that after this 1 episode with mania that lasted 2 days.  He did not have many signs at all.  He and his mom both state that they will watch him to see if this becomes more of a pattern or is more severe when it does happen.  If so, they will call and we will increase Risperdal and see him sooner than the next scheduled appointment. Continue Xanax 1 mg 3  times daily as needed. Continue gabapentin 600 mg, 2 p.o. 3 times daily. Continue Paxil 20 mg daily. Continue Seroquel 400 mg, 2.5 pills nightly. Continue Risperdal 1 mg, 5 p.o. nightly. He will be seeing his PCP sometime within the next month or so and will have routine labs drawn.  He will have those results sent to me. Return in 3 months.  Melony Overly, PA-C

## 2020-01-03 NOTE — Telephone Encounter (Signed)
Opened in Error.

## 2020-02-23 ENCOUNTER — Other Ambulatory Visit: Payer: Self-pay

## 2020-02-26 ENCOUNTER — Encounter: Payer: Self-pay | Admitting: Family Medicine

## 2020-02-26 ENCOUNTER — Ambulatory Visit (INDEPENDENT_AMBULATORY_CARE_PROVIDER_SITE_OTHER): Payer: Medicare Other | Admitting: Family Medicine

## 2020-02-26 ENCOUNTER — Other Ambulatory Visit: Payer: Self-pay

## 2020-02-26 VITALS — BP 111/77 | HR 89 | Temp 97.7°F | Resp 16 | Ht 71.0 in | Wt 169.2 lb

## 2020-02-26 DIAGNOSIS — Z0000 Encounter for general adult medical examination without abnormal findings: Secondary | ICD-10-CM

## 2020-02-26 DIAGNOSIS — Z79899 Other long term (current) drug therapy: Secondary | ICD-10-CM | POA: Diagnosis not present

## 2020-02-26 LAB — COMPREHENSIVE METABOLIC PANEL
ALT: 20 U/L (ref 0–53)
AST: 14 U/L (ref 0–37)
Albumin: 4.8 g/dL (ref 3.5–5.2)
Alkaline Phosphatase: 69 U/L (ref 39–117)
BUN: 18 mg/dL (ref 6–23)
CO2: 24 mEq/L (ref 19–32)
Calcium: 10.2 mg/dL (ref 8.4–10.5)
Chloride: 104 mEq/L (ref 96–112)
Creatinine, Ser: 1.09 mg/dL (ref 0.40–1.50)
GFR: 75.02 mL/min (ref >60.00)
Glucose, Bld: 98 mg/dL (ref 70–99)
Potassium: 4.2 mEq/L (ref 3.5–5.1)
Sodium: 137 mEq/L (ref 135–145)
Total Bilirubin: 0.5 mg/dL (ref 0.2–1.2)
Total Protein: 6.8 g/dL (ref 6.0–8.3)

## 2020-02-26 LAB — LIPID PANEL
Cholesterol: 228 mg/dL — ABNORMAL HIGH (ref 0–200)
HDL: 48.9 mg/dL (ref >39.00)
LDL Cholesterol: 155 mg/dL — ABNORMAL HIGH (ref 0–99)
NonHDL: 179.17
Total CHOL/HDL Ratio: 5
Triglycerides: 119 mg/dL (ref 0.0–149.0)
VLDL: 23.8 mg/dL (ref 0.0–40.0)

## 2020-02-26 NOTE — Patient Instructions (Signed)
Give us 2-3 business days to get the results of your labs back.   Keep the diet clean and stay active.  Do monthly self testicular checks in the shower. You are feeling for lumps/bumps that don't belong. If you feel anything like this, let me know!  Let us know if you need anything. 

## 2020-02-26 NOTE — Progress Notes (Addendum)
Chief Complaint  Patient presents with  . Annual Exam    Subjective: Pt here for initial Welcome to Medicare Evaluation. Here w mom.   Vision Screen: 20/25 both, 20/50 R, 20/25 L  Past Medical History:  Diagnosis Date  . ADHD   . Allergy    seasonal  . Blood transfusion without reported diagnosis    2017  . Depression   . GERD (gastroesophageal reflux disease)   . Social anxiety disorder    Family History  Problem Relation Age of Onset  . Colon cancer Neg Hx   . Colon polyps Neg Hx   . Esophageal cancer Neg Hx   . Pancreatic cancer Neg Hx   . Prostate cancer Neg Hx   . Rectal cancer Neg Hx    Past Surgical History:  Procedure Laterality Date  . HIATAL HERNIA REPAIR  08/2016   Current Outpatient Medications on File Prior to Visit  Medication Sig Dispense Refill  . ALPRAZolam (XANAX) 1 MG tablet Take 1 tablet (1 mg total) by mouth 3 (three) times daily as needed for anxiety. 90 tablet 2  . gabapentin (NEURONTIN) 600 MG tablet 2 po q am, 2 q afternoon, 2 qhs. 180 tablet 2  . Multiple Vitamins-Minerals (CENTRUM VITAMINTS PO) Take by mouth daily.    Marland Kitchen PARoxetine (PAXIL) 20 MG tablet Take 1 tablet (20 mg total) by mouth daily. 30 tablet 2  . QUEtiapine (SEROQUEL) 400 MG tablet Take 2.5 tablets (1,000 mg total) by mouth at bedtime. 75 tablet 2  . risperiDONE (RISPERDAL) 1 MG tablet Take 5 tablets (5 mg total) by mouth at bedtime. Take 5 at night 150 tablet 2   No Known Allergies  Females: Smoking, alcohol/drugs, sunscreen  Mental Health/Substance abuse evaluation:  Does see psychiatry team PHQ-2:  Feelings of depression? No  Loss of satisfaction/pleasure in doing things? No   Fall Risk: Less than 2 falls within the past 12 months? Yes Mindful of grabbing bars in bathroom, ruffles in rugs, poorly lit areas, handrails on the stairs? Yes   Discussion of functional ability done: Encouraged to maintain physical activity and flexibility. Live alone? No  Need help with  the following? Bathing: No  Managing money: No  Taking medications: No  Telephone use: No  Transportation: No  Shopping: No  Preparing meals: No   Hearing/Vision screen: Trouble hearing TV or radio when others do not? No  Straining or struggling to hear/understand conversation? No    Cognitive: Alert to person, place, time  Fire safety: Have a working smoke alarm? Yes   Diet Balanced diet? Yes  3 meals daily? No, 2 Assistance needed? No   BP 111/77 (BP Location: Left Arm, Cuff Size: Normal)   Pulse 89   Temp 97.7 F (36.5 C) (Temporal)   Resp 16   Ht 5\' 11"  (1.803 m)   Wt 169 lb 3.2 oz (76.7 kg)   SpO2 97%   BMI 23.60 kg/m   End of life care planning/counseling: Does patient wish to discuss end of life care/planning? Yes  Does the patient have an advanced directive? No  Patient code status/living will: Full code Forms given? Yes   Assessment and Plan Welcome to Medicare preventive visit  Long term current use of antipsychotic medication - Plan: Comprehensive metabolic panel, Lipid panel   Specialists:  GI: , DO Psychiatry: [name], PA-C  Testicular cancer screening- Written plan in AVS for preventative health services. He is too young for other cancer screenings. In the  next 5-10 years, he will not have any other A/B recommended cancer screenings coming due. We will have shared decision-making regarding prostate cancer screening when he turns 39.  Immunizations UTD- will have to wait for Td update as MCR will not cover.   F/u in 1 year or prn. The patient and his mother voiced understanding and agreement to the plan.  [place], DO 02/26/20 8:24 AM

## 2020-02-27 ENCOUNTER — Other Ambulatory Visit: Payer: Self-pay | Admitting: Family Medicine

## 2020-02-27 DIAGNOSIS — E785 Hyperlipidemia, unspecified: Secondary | ICD-10-CM

## 2020-02-27 MED ORDER — ALBUTEROL SULFATE HFA 108 (90 BASE) MCG/ACT IN AERS: 1.0000 | INHALATION_SPRAY | Freq: Four times a day (QID) | RESPIRATORY_TRACT | 0 refills | Status: DC | PRN

## 2020-02-29 ENCOUNTER — Ambulatory Visit: Payer: Medicare Other | Attending: Internal Medicine

## 2020-02-29 DIAGNOSIS — Z23 Encounter for immunization: Secondary | ICD-10-CM

## 2020-02-29 NOTE — Progress Notes (Signed)
   Covid-19 Vaccination Clinic  Name:  KEIFER HABIB    MRN: [id number] DOB: 1981/07/06  02/29/2020  Mr. Murguia was observed post Covid-19 immunization for 15 minutes without incident. He was provided with Vaccine Information Sheet and instruction to access the V-Safe system.   Mr. Nicastro was instructed to call 911 with any severe reactions post vaccine: Marland Kitchen Difficulty breathing  . Swelling of face and throat  . A fast heartbeat  . A bad rash all over body  . Dizziness and weakness   Immunizations Administered    Name Date Dose VIS Date Route   Pfizer COVID-19 Vaccine 02/29/2020  9:40 AM 0.3 mL 10/27/2019 Intramuscular   Manufacturer: [employer], [employer]   Lot: [id number]   NDC: [address]-2

## 2020-03-06 ENCOUNTER — Ambulatory Visit (INDEPENDENT_AMBULATORY_CARE_PROVIDER_SITE_OTHER): Payer: Medicare Other | Admitting: Physician Assistant

## 2020-03-06 ENCOUNTER — Encounter: Payer: Self-pay | Admitting: Physician Assistant

## 2020-03-06 DIAGNOSIS — F411 Generalized anxiety disorder: Secondary | ICD-10-CM | POA: Diagnosis not present

## 2020-03-06 DIAGNOSIS — F259 Schizoaffective disorder, unspecified: Secondary | ICD-10-CM | POA: Diagnosis not present

## 2020-03-06 DIAGNOSIS — F17200 Nicotine dependence, unspecified, uncomplicated: Secondary | ICD-10-CM

## 2020-03-06 MED ORDER — GABAPENTIN 600 MG PO TABS: ORAL_TABLET | ORAL | 2 refills | Status: DC

## 2020-03-06 MED ORDER — RISPERIDONE 1 MG PO TABS: 5.0000 mg | ORAL_TABLET | Freq: Every day | ORAL | 2 refills | Status: DC

## 2020-03-06 MED ORDER — PAROXETINE HCL 20 MG PO TABS: 20.0000 mg | ORAL_TABLET | Freq: Every day | ORAL | 2 refills | Status: DC

## 2020-03-06 MED ORDER — ALPRAZOLAM 1 MG PO TABS: 1.0000 mg | ORAL_TABLET | Freq: Three times a day (TID) | ORAL | 2 refills | Status: DC | PRN

## 2020-03-06 MED ORDER — QUETIAPINE FUMARATE 400 MG PO TABS: 1000.0000 mg | ORAL_TABLET | Freq: Every day | ORAL | 2 refills | Status: DC

## 2020-03-06 NOTE — Progress Notes (Signed)
[facility]  Patient ID: Reginald Dunn,  MRN: [id number]  PCP: Reginald Pal, DO  Date of Evaluation: 03/06/2020 Time spent:20 minutes  Chief Complaint:  Chief Complaint    Anxiety; Depression; Insomnia; Follow-up     Virtual Visit via Telephone Note  I connected with patient by a video enabled telemedicine application or telephone, with their informed consent, and verified patient privacy and that I am speaking with the correct person using two identifiers.  I am private, in my office and the patient is home.  I discussed the limitations, risks, security and privacy concerns of performing an evaluation and management service by video and the availability of in person appointments. I also discussed with the patient that there may be a patient responsible charge related to this service. The patient expressed understanding and agreed to proceed.   I discussed the assessment and treatment plan with the patient. The patient was provided an opportunity to ask questions and all were answered. The patient agreed with the plan and demonstrated an understanding of the instructions.   The patient was advised to call back or seek an in-person evaluation if the symptoms worsen or if the condition fails to improve as anticipated.  I provided 20 minutes of non-face-to-face time during this encounter.  HISTORY/CURRENT STATUS: HPI for 76-month med check.  Mom is also on video call.  Reginald Dunn states he is doing well overall.  He is depressed at times, states he is lonely.  He is able to enjoy things however and energy and motivation are good.  He is not sleeping all the time or wanting to stay in bed all the time.  He gets about 6 to 7 hours of sleep and feels rested the next day.  Not isolating.  No SI/HI.  Patient denies increased energy with decreased need for sleep, no increased talkativeness, no racing thoughts, no impulsivity or risky behaviors, no increased spending, no  increased libido, no grandiosity, no increased irritability or anger, and no hallucinations.  Anxiety is controlled.  He is not always needing the Xanax 3 times a day.  It is effective when needed.  He has more of a generalized sense of something bad happening than panic attacks.  Denies dizziness, syncope, seizures, numbness, tingling, tremor, tics, unsteady gait, slurred speech, confusion. Denies muscle or joint pain, stiffness, or dystonia. Denies unexplained weight loss, frequent infections, or sores that heal slowly.  No polyphagia, polydipsia, or polyuria. Denies visual changes or paresthesias.   He had labs done by PCP recently and cholesterol was elevated.  See results on chart.  His PCP will recheck it again in approximately 6 weeks from the date of that lab.  Individual Medical History/ Review of Systems: Changes? :No    Past medications for mental health diagnoses include: Pristiq, Celexa, lithium, Depakote, carbamazepine, Ativan, Ambien, gabapentin, Ritalin, Vyvanse, Zoloft, Wellbutrin, Latuda, Seroquel, Zyprexa, Risperdal, Lamictal, Vistaril, BuSpar, clozapine, Klonopin, Paxil  Allergies: Patient has no known allergies.  Current Medications:  Current Outpatient Medications:  .  albuterol (VENTOLIN HFA) 108 (90 Base) MCG/ACT inhaler, Inhale 1 puff into the lungs every 6 (six) hours as needed for wheezing or shortness of breath., Disp: 18 g, Rfl: 0 .  ALPRAZolam (XANAX) 1 MG tablet, Take 1 tablet (1 mg total) by mouth 3 (three) times daily as needed for anxiety., Disp: 90 tablet, Rfl: 2 .  gabapentin (NEURONTIN) 600 MG tablet, 2 po q am, 2 q afternoon, 2 qhs., Disp: 180 tablet, Rfl: 2 .  Multiple Vitamins-Minerals (CENTRUM VITAMINTS PO), Take by mouth daily., Disp: , Rfl:  .  PARoxetine (PAXIL) 20 MG tablet, Take 1 tablet (20 mg total) by mouth daily., Disp: 30 tablet, Rfl: 2 .  QUEtiapine (SEROQUEL) 400 MG tablet, Take 2.5 tablets (1,000 mg total) by mouth at bedtime., Disp: 75  tablet, Rfl: 2 .  risperiDONE (RISPERDAL) 1 MG tablet, Take 5 tablets (5 mg total) by mouth at bedtime. Take 5 at night, Disp: 150 tablet, Rfl: 2 Medication Side Effects: none  Family Medical/ Social History: Changes? No  MENTAL HEALTH EXAM:  There were no vitals taken for this visit.There is no height or weight on file to calculate BMI.  General Appearance: Casual, Neat and Well Groomed  Eye Contact:  Good  Speech:  Clear and Coherent and Normal Rate  Volume:  Normal  Mood:  Euthymic  Affect:  Appropriate  Thought Process:  Goal Directed and Descriptions of Associations: Intact  Orientation:  Full (Time, Place, and Person)  Thought Content: Logical   Suicidal Thoughts:  No  Homicidal Thoughts:  No  Memory:  WNL  Judgement:  Good  Insight:  Good  Psychomotor Activity:  Normal  Concentration:  Concentration: Good  Recall:  Good  Fund of Knowledge: Good  Language: Good  Assets:  Desire for Improvement  ADL's:  Intact  Cognition: WNL  Prognosis:  Good  02/26/2020 Chol 228, Trig 119, HDL 48, LDL 155. [id number] 98. All nl  DIAGNOSES:    ICD-10-CM   1. Smoker  F17.200   2. Generalized anxiety disorder  F41.1 ALPRAZolam (XANAX) 1 MG tablet    gabapentin (NEURONTIN) 600 MG tablet    PARoxetine (PAXIL) 20 MG tablet    QUEtiapine (SEROQUEL) 400 MG tablet    risperiDONE (RISPERDAL) 1 MG tablet  3. Schizoaffective disorder, unspecified type (HCC)  F25.9 QUEtiapine (SEROQUEL) 400 MG tablet    risperiDONE (RISPERDAL) 1 MG tablet    Receiving Psychotherapy: No    RECOMMENDATIONS:  PDMP was reviewed. I spent 20 minutes with him. I am glad that he is doing well! Continue Xanax 1 mg, 1 p.o. 3 times daily as needed anxiety. Continue gabapentin 600 mg, 2 p.o. 3 times daily. Continue Paxil 20 mg, 1 p.o. daily. Continue Seroquel 400 mg, 2.5 pills nightly. Continue Risperdal 1 mg, 5 p.o. nightly. I am available to help him with smoking cessation anytime he is ready. Return in 3  months.  Reginald Overly, PA-C

## 2020-03-25 ENCOUNTER — Ambulatory Visit: Payer: Medicare Other | Attending: Internal Medicine

## 2020-03-25 DIAGNOSIS — Z23 Encounter for immunization: Secondary | ICD-10-CM

## 2020-03-25 NOTE — Progress Notes (Signed)
   Covid-19 Vaccination Clinic  Name:  COLLINS KERBY    MRN: [id number] DOB: [birth year]  03/25/2020  Mr. Pickering was observed post Covid-19 immunization for 15 minutes without incident. He was provided with Vaccine Information Sheet and instruction to access the V-Safe system.   Mr. Delsol was instructed to call 911 with any severe reactions post vaccine: Marland Kitchen Difficulty breathing  . Swelling of face and throat  . A fast heartbeat  . A bad rash all over body  . Dizziness and weakness   Immunizations Administered    Name Date Dose VIS Date Route   Pfizer COVID-19 Vaccine 03/25/2020  8:26 AM 0.3 mL 01/10/2019 Intramuscular   Manufacturer: [employer], [employer]   Lot: [id number]   NDC: [address]-7

## 2020-04-08 IMAGING — CT CT HEAD WITHOUT AND WITH CONTRAST
4 of 5 series · 15 of 47 positions shown, 17 images · IV contrast (omnipaque)
Comparison: None.

CLINICAL DATA: Nausea vomiting

EXAM:
CT HEAD WITHOUT AND WITH CONTRAST
TECHNIQUE: Contiguous axial images were obtained from the base of the skull
through the vertex without and with intravenous contrast
CONTRAST:  75mL OMNIPAQUE IOHEXOL 300 MG/ML  SOLN

[Series 2: head wo · axial · 0.48mm/px · z∈[-65,+25]mm · 4 of 31 slices shown]
[im 7/31  brain]
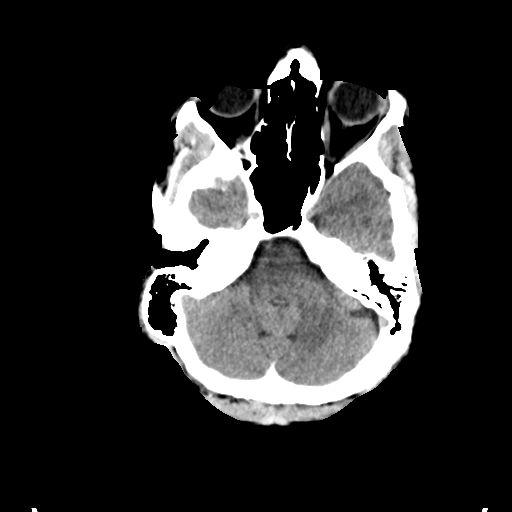
[im 13/31  brain]
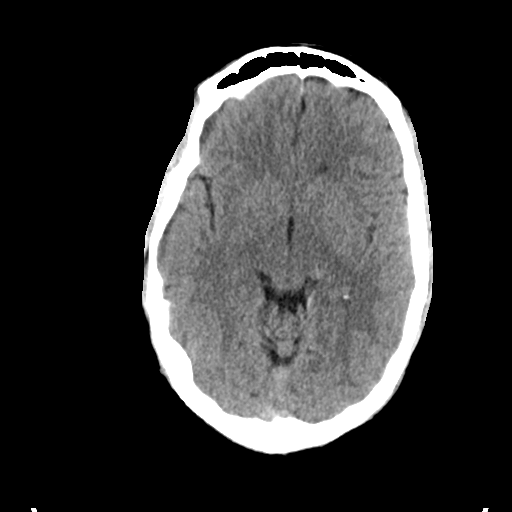
[im 19/31  brain]
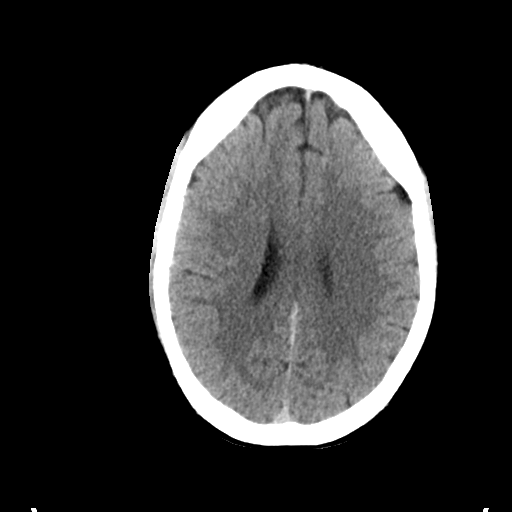
[im 25/31  brain]
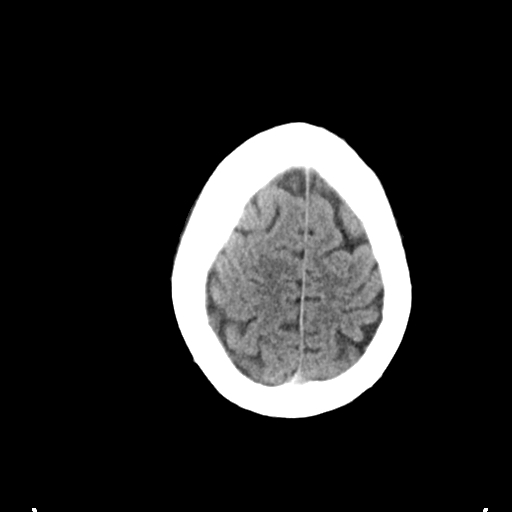

[Series 4: head w · axial · 0.48mm/px · z∈[-70,+30]mm · 5 of 31 slices shown, 7 images]
[im 6/31  brain]
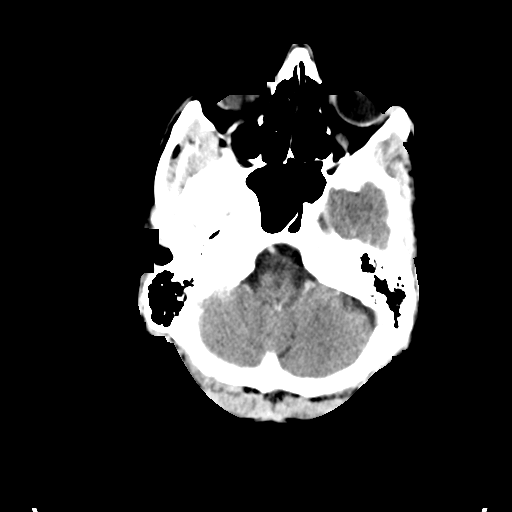
[im 6/31  bone]
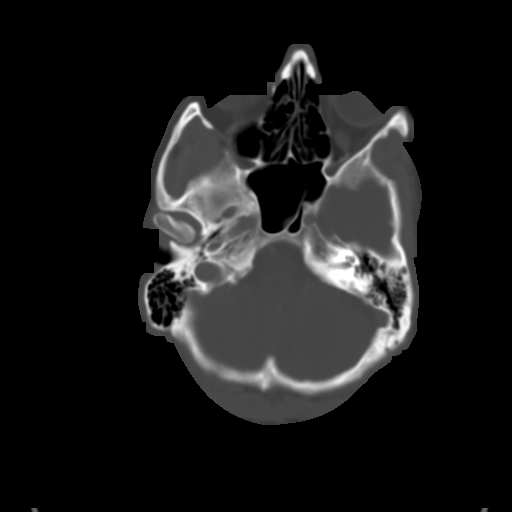
[im 11/31  brain]
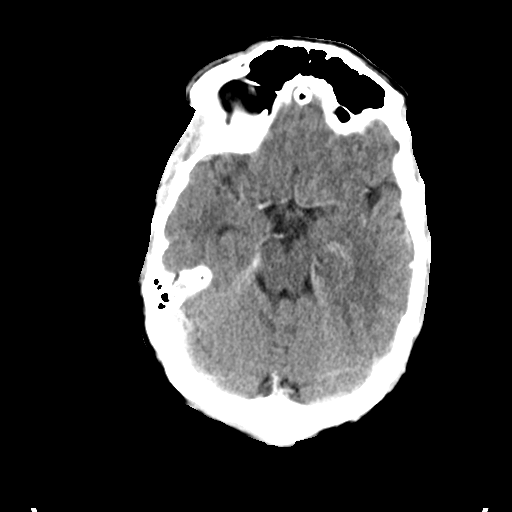
[im 16/31  brain]
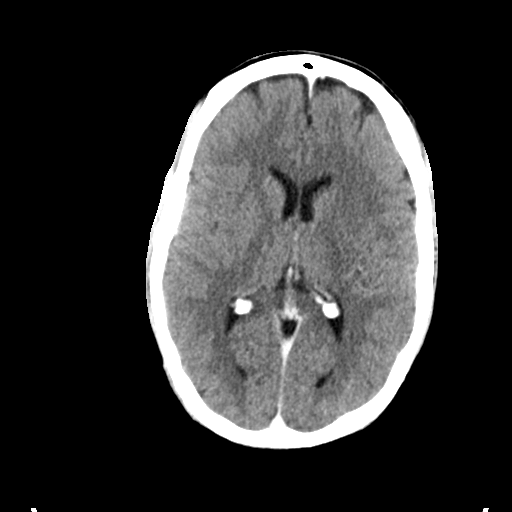
[im 21/31  brain]
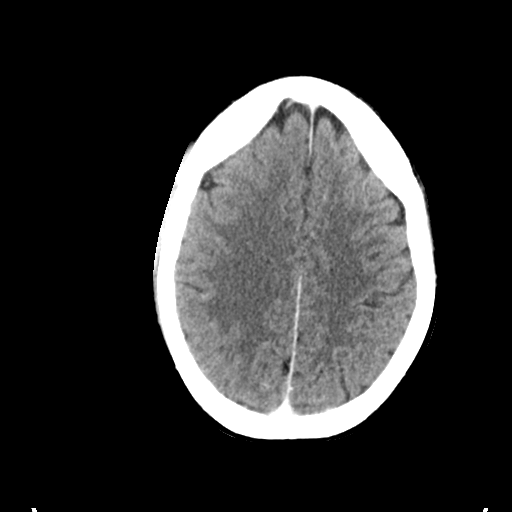
[im 26/31  brain]
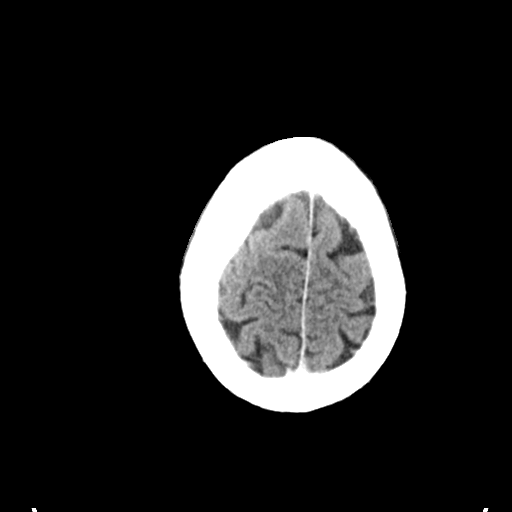
[im 26/31  bone]
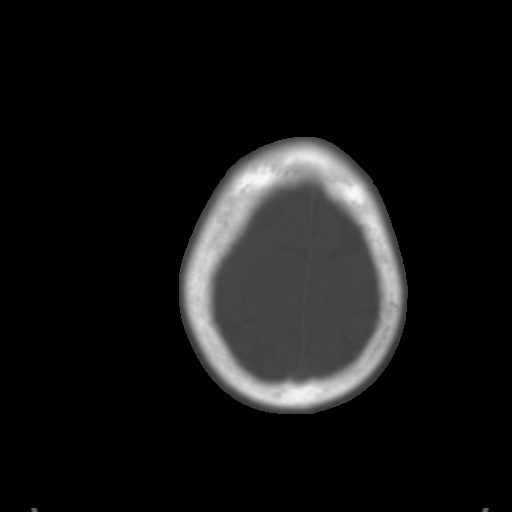

[Series 5: coronal soft tissue · coronal · 0.35mm/px · 3 of 69 slices shown]
[im 23/69  brain]
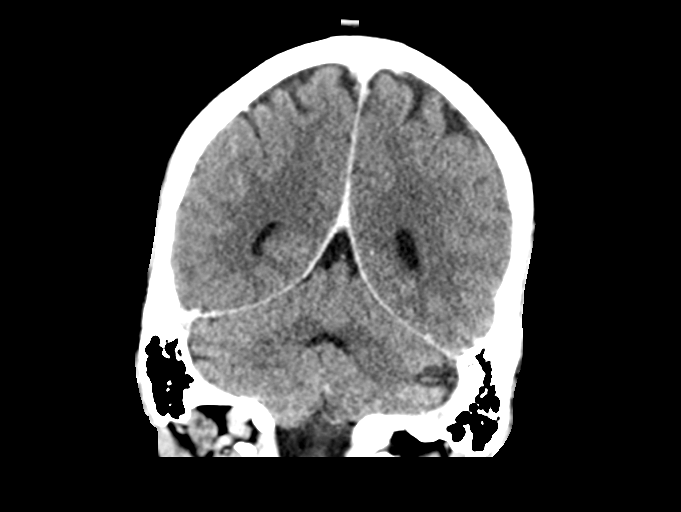
[im 31/69  brain]
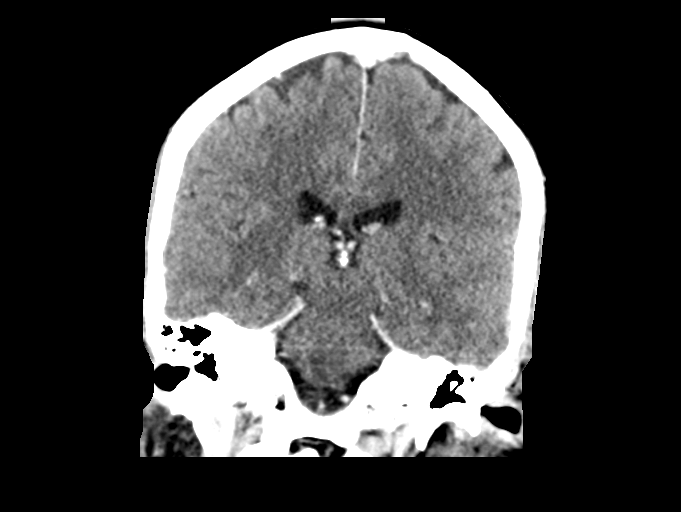
[im 38/69  brain]
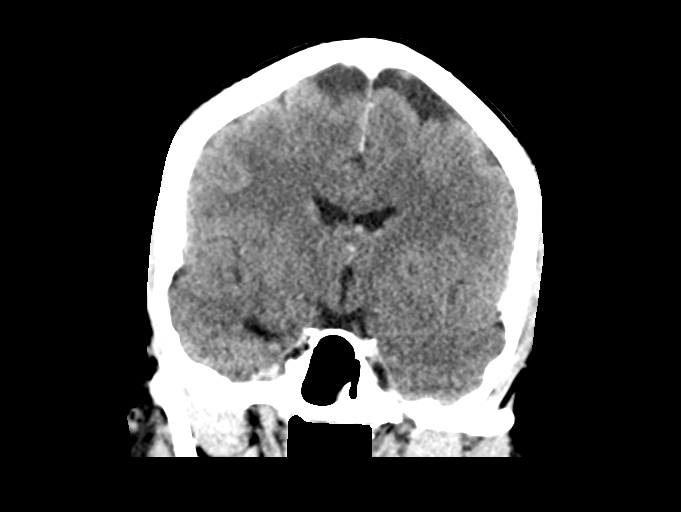

[Series 6: sagittal soft tissue · sagittal · 0.34mm/px · 3 of 51 slices shown]
[im 17/51  brain]
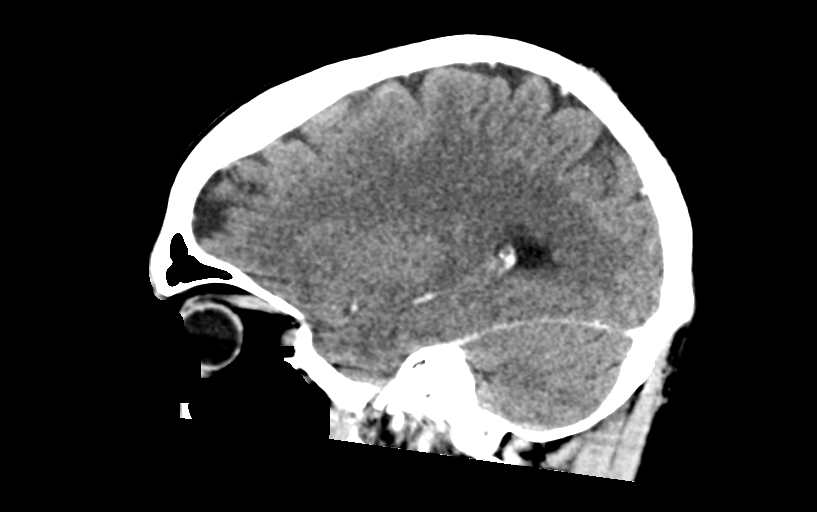
[im 26/51  brain]
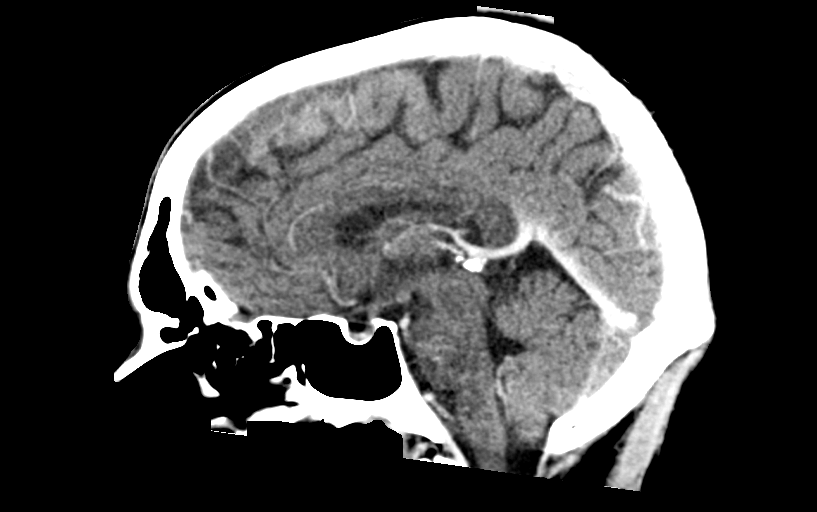
[im 34/51  brain]
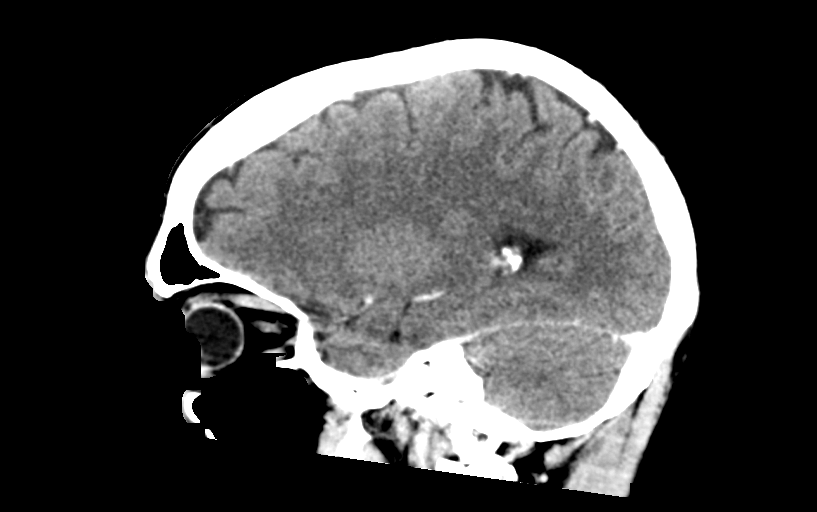

[15 of 47 positions shown; findings below may reference images not displayed]

FINDINGS: Brain: No evidence of acute infarction, hemorrhage, hydrocephalus,
extra-axial collection or mass lesion/mass effect.

Normal enhancement postcontrast administration

Vascular: Negative for hyperdense vessel. Normal venous enhancement.

Skull: Negative

Sinuses/Orbits: Negative

Other: None
IMPRESSION: Negative CT head with contrast.

## 2020-04-09 ENCOUNTER — Other Ambulatory Visit: Payer: Medicare Other

## 2020-04-17 ENCOUNTER — Telehealth: Payer: Self-pay

## 2020-04-17 ENCOUNTER — Other Ambulatory Visit: Payer: Medicare Other

## 2020-04-25 ENCOUNTER — Other Ambulatory Visit (INDEPENDENT_AMBULATORY_CARE_PROVIDER_SITE_OTHER): Payer: Medicare Other

## 2020-04-25 ENCOUNTER — Other Ambulatory Visit: Payer: Self-pay

## 2020-04-25 DIAGNOSIS — E785 Hyperlipidemia, unspecified: Secondary | ICD-10-CM

## 2020-04-25 LAB — LIPID PANEL
Cholesterol: 218 mg/dL — ABNORMAL HIGH (ref 0–200)
HDL: 60 mg/dL (ref >39.00)
LDL Cholesterol: 129 mg/dL — ABNORMAL HIGH (ref 0–99)
NonHDL: 158.22
Total CHOL/HDL Ratio: 4
Triglycerides: 148 mg/dL (ref 0.0–149.0)
VLDL: 29.6 mg/dL (ref 0.0–40.0)

## 2020-04-26 ENCOUNTER — Other Ambulatory Visit: Payer: Self-pay | Admitting: Family Medicine

## 2020-04-26 DIAGNOSIS — E785 Hyperlipidemia, unspecified: Secondary | ICD-10-CM

## 2020-06-05 ENCOUNTER — Telehealth: Payer: Self-pay | Admitting: Physician Assistant

## 2020-06-05 ENCOUNTER — Encounter: Payer: Self-pay | Admitting: Physician Assistant

## 2020-06-05 ENCOUNTER — Telehealth (INDEPENDENT_AMBULATORY_CARE_PROVIDER_SITE_OTHER): Payer: Medicare Other | Admitting: Physician Assistant

## 2020-06-05 DIAGNOSIS — F17200 Nicotine dependence, unspecified, uncomplicated: Secondary | ICD-10-CM

## 2020-06-05 DIAGNOSIS — F411 Generalized anxiety disorder: Secondary | ICD-10-CM

## 2020-06-05 DIAGNOSIS — F259 Schizoaffective disorder, unspecified: Secondary | ICD-10-CM | POA: Diagnosis not present

## 2020-06-05 MED ORDER — RISPERIDONE 1 MG PO TABS: 5.0000 mg | ORAL_TABLET | Freq: Every day | ORAL | 2 refills | Status: DC

## 2020-06-05 MED ORDER — GABAPENTIN 600 MG PO TABS: ORAL_TABLET | ORAL | 2 refills | Status: DC

## 2020-06-05 MED ORDER — QUETIAPINE FUMARATE 400 MG PO TABS: 1000.0000 mg | ORAL_TABLET | Freq: Every day | ORAL | 2 refills | Status: DC

## 2020-06-05 MED ORDER — ALPRAZOLAM 1 MG PO TABS: 1.0000 mg | ORAL_TABLET | Freq: Three times a day (TID) | ORAL | 2 refills | Status: DC | PRN

## 2020-06-05 MED ORDER — PAROXETINE HCL 20 MG PO TABS: 20.0000 mg | ORAL_TABLET | Freq: Every day | ORAL | 2 refills | Status: DC

## 2020-06-05 NOTE — Telephone Encounter (Signed)
Mr. Reginald Dunn, Reginald Dunn are scheduled for a virtual visit with your provider today.    Just as we do with appointments in the office, we must obtain your consent to participate.  Your consent will be active for this visit and any virtual visit you may have with one of our providers in the next 365 days.    If you have a MyChart account, I can also send a copy of this consent to you electronically.  All virtual visits are billed to your insurance company just like a traditional visit in the office.  As this is a virtual visit, video technology does not allow for your provider to perform a traditional examination.  This may limit your provider's ability to fully assess your condition.  If your provider identifies any concerns that need to be evaluated in person or the need to arrange testing such as labs, EKG, etc, we will make arrangements to do so.    Although advances in technology are sophisticated, we cannot ensure that it will always work on either your end or our end.  If the connection with a video visit is poor, we may have to switch to a telephone visit.  With either a video or telephone visit, we are not always able to ensure that we have a secure connection.   I need to obtain your verbal consent now.   Are you willing to proceed with your visit today?   JOSHUA SOULIER has provided verbal consent on 06/05/2020 for a virtual visit (video or telephone).   Melony Overly, PA-C 06/05/2020  11:01 AM

## 2020-06-05 NOTE — Progress Notes (Signed)
[facility]  Patient ID: Reginald Dunn,  MRN: [id number]  PCP: Reginald Dory, DO  Date of Evaluation: 06/05/2020 Time spent:20 minutes  Chief Complaint:  Chief Complaint    Follow-up     Virtual Visit via Telehealth  I connected with patient by a video enabled telemedicine application, with their informed consent, and verified patient privacy and that I am speaking with the correct person using two identifiers.  I am private, in my office and the patient is at home.  I discussed the limitations, risks, security and privacy concerns of performing an evaluation and management service by video and the availability of in person appointments. I also discussed with the patient that there may be a patient responsible charge related to this service. The patient expressed understanding and agreed to proceed.   I discussed the assessment and treatment plan with the patient. The patient was provided an opportunity to ask questions and all were answered. The patient agreed with the plan and demonstrated an understanding of the instructions.   The patient was advised to call back or seek an in-person evaluation if the symptoms worsen or if the condition fails to improve as anticipated.  I provided 20 minutes of non-face-to-face time during this encounter.  HISTORY/CURRENT STATUS: HPI for 63-month med check.  Mom, Reginald Dunn, is also on call.  Reginald Dunn states he is doing well overall.  Glad it's summer time, he usually does better in the summer.  He is able to enjoy things but still isolates, even when family was here recently, he just likes to be alone. Energy and motivation are good.  He is not sleeping all the time or wanting to stay in bed all the time.  He gets about 6 to 7 hours of sleep and feels rested the next day.  No SI/HI.  He is still smoking and although he would like to quit, he does not want any help such as Chantix.  He has tried NicoDerm patches in the past.  He  will let me know if he would like help from me at any time.  Patient denies increased energy with decreased need for sleep, no increased talkativeness, no racing thoughts, no impulsivity or risky behaviors, no increased spending, no increased libido, no grandiosity, no increased irritability or anger, no paranoia, and no hallucinations.  Anxiety is controlled.  He is not always needing the Xanax 3 times a day.  It is effective when needed.  He has more of a generalized sense of something bad happening than panic attacks.  Denies dizziness, syncope, seizures, numbness, tingling, tremor, tics, unsteady gait, slurred speech, confusion. Denies muscle or joint pain, stiffness, or dystonia. Denies unexplained weight loss, frequent infections, or sores that heal slowly.  No polyphagia, polydipsia, or polyuria. Denies visual changes or paresthesias.   Individual Medical History/ Review of Systems: Changes? :No    Past medications for mental health diagnoses include: Pristiq, Celexa, lithium, Depakote, carbamazepine, Ativan, Ambien, gabapentin, Ritalin, Vyvanse, Zoloft, Wellbutrin, Latuda, Seroquel, Zyprexa, Risperdal, Lamictal, Vistaril, BuSpar, clozapine, Klonopin, Paxil  Allergies: Patient has no known allergies.  Current Medications:  Current Outpatient Medications:  .  albuterol (VENTOLIN HFA) 108 (90 Base) MCG/ACT inhaler, Inhale 1 puff into the lungs every 6 (six) hours as needed for wheezing or shortness of breath., Disp: 18 g, Rfl: 0 .  ALPRAZolam (XANAX) 1 MG tablet, Take 1 tablet (1 mg total) by mouth 3 (three) times daily as needed for anxiety., Disp: 90 tablet, Rfl: 2 .  gabapentin (NEURONTIN) 600 MG tablet, 2 po q am, 2 q afternoon, 2 qhs., Disp: 180 tablet, Rfl: 2 .  Multiple Vitamins-Minerals (CENTRUM VITAMINTS PO), Take by mouth daily., Disp: , Rfl:  .  PARoxetine (PAXIL) 20 MG tablet, Take 1 tablet (20 mg total) by mouth daily., Disp: 30 tablet, Rfl: 2 .  QUEtiapine (SEROQUEL) 400 MG  tablet, Take 2.5 tablets (1,000 mg total) by mouth at bedtime., Disp: 75 tablet, Rfl: 2 .  risperiDONE (RISPERDAL) 1 MG tablet, Take 5 tablets (5 mg total) by mouth at bedtime. Take 5 at night, Disp: 150 tablet, Rfl: 2 Medication Side Effects: none  Family Medical/ Social History: Changes? No  MENTAL HEALTH EXAM:  There were no vitals taken for this visit.There is no height or weight on file to calculate BMI.  General Appearance: Unable to assess  Eye Contact:  Unable to assess  Speech:  Clear and Coherent and Normal Rate  Volume:  Normal  Mood:  Euthymic  Affect:  Appropriate  Thought Process:  Goal Directed and Descriptions of Associations: Intact  Orientation:  Full (Time, Place, and Person)  Thought Content: Logical   Suicidal Thoughts:  No  Homicidal Thoughts:  No  Memory:  WNL  Judgement:  Good  Insight:  Good  Psychomotor Activity:  Unable to assess  Concentration:  Concentration: Good  Recall:  Good  Fund of Knowledge: Good  Language: Good  Assets:  Desire for Improvement  ADL's:  Intact  Cognition: WNL  Prognosis:  Good   June 2021 cholesterol at his PCPs office was slightly elevated as far as total cholesterol goes and LDL was 129.  HDL was 60.  See results on chart.   DIAGNOSES:    ICD-10-CM   1. Schizoaffective disorder, unspecified type (HCC)  F25.9 QUEtiapine (SEROQUEL) 400 MG tablet    risperiDONE (RISPERDAL) 1 MG tablet  2. Generalized anxiety disorder  F41.1 ALPRAZolam (XANAX) 1 MG tablet    gabapentin (NEURONTIN) 600 MG tablet    PARoxetine (PAXIL) 20 MG tablet    QUEtiapine (SEROQUEL) 400 MG tablet    risperiDONE (RISPERDAL) 1 MG tablet  3. Smoker  F17.200     Receiving Psychotherapy: No    RECOMMENDATIONS:  PDMP was reviewed. I provided 20 minutes of nonface-to-face time during this encounter. I am glad that he is doing well! Continue Xanax 1 mg, 1 p.o. 3 times daily as needed anxiety. Continue gabapentin 600 mg, 2 p.o. 3 times  daily. Continue Paxil 20 mg, 1 p.o. daily. Continue Seroquel 400 mg, 2.5 pills nightly. Continue Risperdal 1 mg, 5 p.o. nightly. I am available to help him with smoking cessation anytime he is ready. Return in 3 months.  Melony Overly, PA-C

## 2020-08-28 ENCOUNTER — Other Ambulatory Visit: Payer: Self-pay | Admitting: Physician Assistant

## 2020-08-28 DIAGNOSIS — F411 Generalized anxiety disorder: Secondary | ICD-10-CM

## 2020-08-28 DIAGNOSIS — F259 Schizoaffective disorder, unspecified: Secondary | ICD-10-CM

## 2020-09-03 ENCOUNTER — Encounter: Payer: Self-pay | Admitting: Physician Assistant

## 2020-09-03 ENCOUNTER — Telehealth (INDEPENDENT_AMBULATORY_CARE_PROVIDER_SITE_OTHER): Payer: Medicare Other | Admitting: Physician Assistant

## 2020-09-03 DIAGNOSIS — F411 Generalized anxiety disorder: Secondary | ICD-10-CM | POA: Diagnosis not present

## 2020-09-03 DIAGNOSIS — F99 Mental disorder, not otherwise specified: Secondary | ICD-10-CM | POA: Diagnosis not present

## 2020-09-03 DIAGNOSIS — F4010 Social phobia, unspecified: Secondary | ICD-10-CM | POA: Diagnosis not present

## 2020-09-03 DIAGNOSIS — F259 Schizoaffective disorder, unspecified: Secondary | ICD-10-CM | POA: Diagnosis not present

## 2020-09-03 DIAGNOSIS — F5105 Insomnia due to other mental disorder: Secondary | ICD-10-CM | POA: Diagnosis not present

## 2020-09-03 DIAGNOSIS — F17200 Nicotine dependence, unspecified, uncomplicated: Secondary | ICD-10-CM

## 2020-09-03 MED ORDER — RISPERIDONE 1 MG PO TABS: 5.0000 mg | ORAL_TABLET | Freq: Every day | ORAL | 2 refills | Status: DC

## 2020-09-03 MED ORDER — GABAPENTIN 600 MG PO TABS: ORAL_TABLET | ORAL | 2 refills | Status: DC

## 2020-09-03 MED ORDER — ALPRAZOLAM 1 MG PO TABS: 1.0000 mg | ORAL_TABLET | Freq: Three times a day (TID) | ORAL | 2 refills | Status: DC | PRN

## 2020-09-03 MED ORDER — QUETIAPINE FUMARATE 400 MG PO TABS: ORAL_TABLET | ORAL | 2 refills | Status: DC

## 2020-09-03 MED ORDER — PAROXETINE HCL 20 MG PO TABS: 20.0000 mg | ORAL_TABLET | Freq: Every day | ORAL | 2 refills | Status: DC

## 2020-09-03 NOTE — Progress Notes (Signed)
[facility]  Patient ID: Reginald Dunn,  MRN: [id number]  PCP: Sharlene Dory, DO  Date of Evaluation: 09/03/2020 Time spent:30 minutes  Chief Complaint:  Chief Complaint    Follow-up     Virtual Visit via Telehealth  I connected with patient telephone, with their informed consent, and verified patient privacy and that I am speaking with the correct person using two identifiers.  I am private, in my office and the patient is at home.  I discussed the limitations, risks, security and privacy concerns of performing an evaluation and management service by telephone and the availability of in person appointments. I also discussed with the patient that there may be a patient responsible charge related to this service. The patient expressed understanding and agreed to proceed.   I discussed the assessment and treatment plan with the patient. The patient was provided an opportunity to ask questions and all were answered. The patient agreed with the plan and demonstrated an understanding of the instructions.   The patient was advised to call back or seek an in-person evaluation if the symptoms worsen or if the condition fails to improve as anticipated.  I provided 20 minutes of non-face-to-face time during this encounter.  HISTORY/CURRENT STATUS: HPI for 23-month med check.  Mom, [name], is also on call.  Abdirizak is doing well.  As long as he gets good sleep, he usually has good days.  He is able to enjoy things but still isolates, he just likes to be alone.  They had several family members visit over the summer and that went well, but he still preferred to be alone even with the family.  Energy and motivation are good.  Does not cry easily.  Appetite is normal.  [name] agrees that his mood is good for the most part.  No SI/HI.  He continues to smoke and states he is not ready to quit.  He has tried NicoDerm patches in the past without success.  He will let me know if I  can ever help with smoking cessation.  Anxiety is controlled.  He is not always needing the Xanax 3 times a day.  It is effective when needed.  He has more of a generalized sense of something bad happening than panic attacks.  Patient denies increased energy with decreased need for sleep, no increased talkativeness, no racing thoughts, no impulsivity or risky behaviors, no increased spending, no increased libido, no grandiosity, no increased irritability or anger, specifically he denies paranoia and no hallucinations.  Denies dizziness, syncope, seizures, numbness, tingling, tremor, tics, unsteady gait, slurred speech, confusion. Denies muscle or joint pain, stiffness, or dystonia. Denies unexplained weight loss, frequent infections, or sores that heal slowly.  No polyphagia, polydipsia, or polyuria. Denies visual changes or paresthesias.   Individual Medical History/ Review of Systems: Changes? :No    Past medications for mental health diagnoses include: Pristiq, Celexa, lithium, Depakote, carbamazepine, Ativan, Ambien, gabapentin, Ritalin, Vyvanse, Zoloft, Wellbutrin, Latuda, Seroquel, Zyprexa, Risperdal, Lamictal, Vistaril, BuSpar, clozapine, Klonopin, Paxil  Allergies: Patient has no known allergies.  Current Medications:  Current Outpatient Medications:  .  albuterol (VENTOLIN HFA) 108 (90 Base) MCG/ACT inhaler, Inhale 1 puff into the lungs every 6 (six) hours as needed for wheezing or shortness of breath., Disp: 18 g, Rfl: 0 .  ALPRAZolam (XANAX) 1 MG tablet, Take 1 tablet (1 mg total) by mouth 3 (three) times daily as needed for anxiety., Disp: 90 tablet, Rfl: 2 .  gabapentin (NEURONTIN) 600 MG  tablet, 2 po q am, 2 q afternoon, 2 qhs., Disp: 180 tablet, Rfl: 2 .  Multiple Vitamins-Minerals (CENTRUM VITAMINTS PO), Take by mouth daily., Disp: , Rfl:  .  PARoxetine (PAXIL) 20 MG tablet, Take 1 tablet (20 mg total) by mouth daily., Disp: 30 tablet, Rfl: 2 .  QUEtiapine (SEROQUEL) 400 MG  tablet, TAKE 2 AND 1/2 TABLETS BY MOUTH AT BEDTIME, Disp: 75 tablet, Rfl: 2 .  risperiDONE (RISPERDAL) 1 MG tablet, Take 5 tablets (5 mg total) by mouth at bedtime. Take 5 at night, Disp: 150 tablet, Rfl: 2 Medication Side Effects: none  Family Medical/ Social History: Changes? No  MENTAL HEALTH EXAM:  There were no vitals taken for this visit.There is no height or weight on file to calculate BMI.  General Appearance: Unable to assess  Eye Contact:  Unable to assess  Speech:  Clear and Coherent and Normal Rate  Volume:  Normal  Mood:  Euthymic  Affect:  Appropriate  Thought Process:  Goal Directed and Descriptions of Associations: Intact  Orientation:  Full (Time, Place, and Person)  Thought Content: Logical   Suicidal Thoughts:  No  Homicidal Thoughts:  No  Memory:  WNL  Judgement:  Good  Insight:  Good  Psychomotor Activity:  Unable to assess  Concentration:  Concentration: Good  Recall:  Good  Fund of Knowledge: Good  Language: Good  Assets:  Desire for Improvement  ADL's:  Intact  Cognition: WNL  Prognosis:  Good   Most recent pertinent labs  04/25/2020 lipid profile cholesterol was 218, HDL 60, LDL 129, triglycerides 148. 02/26/2020 CMP was completely normal, specifically glucose was 98.  DIAGNOSES:    ICD-10-CM   1. Social anxiety disorder  F40.10   2. Generalized anxiety disorder  F41.1 ALPRAZolam (XANAX) 1 MG tablet    PARoxetine (PAXIL) 20 MG tablet    QUEtiapine (SEROQUEL) 400 MG tablet    risperiDONE (RISPERDAL) 1 MG tablet    gabapentin (NEURONTIN) 600 MG tablet  3. Schizoaffective disorder, unspecified type (HCC)  F25.9 QUEtiapine (SEROQUEL) 400 MG tablet    risperiDONE (RISPERDAL) 1 MG tablet  4. Smoker  F17.200   5. Insomnia due to other mental disorder  F51.05    F99     Receiving Psychotherapy: No    RECOMMENDATIONS:  PDMP was reviewed. I provided 30 minutes of nonface-to-face time during this encounter, including lab review, smoking cessation  counseling. I am glad that he is doing well! We again discussed smoking cessation.  He states he is not ready to quit right now but will let me know if/when he is and I will do my best to help him. Continue Xanax 1 mg, 1 p.o. 3 times daily as needed anxiety. Continue gabapentin 600 mg, 2 p.o. 3 times daily. Continue Paxil 20 mg, 1 p.o. daily. Continue Seroquel 400 mg, 2.5 pills nightly. Continue Risperdal 1 mg, 5 p.o. nightly. [name] states that he has a physical in December and she will ask his PCP to send the results to me or to notify me that they are available in epic. Return in 3 months.  Melony Overly, PA-C

## 2020-09-26 ENCOUNTER — Other Ambulatory Visit (INDEPENDENT_AMBULATORY_CARE_PROVIDER_SITE_OTHER): Payer: Medicare Other

## 2020-09-26 ENCOUNTER — Other Ambulatory Visit: Payer: Self-pay

## 2020-09-26 DIAGNOSIS — E785 Hyperlipidemia, unspecified: Secondary | ICD-10-CM | POA: Diagnosis not present

## 2020-09-26 LAB — LIPID PANEL
Cholesterol: 198 mg/dL (ref 0–200)
HDL: 61.2 mg/dL (ref >39.00)
LDL Cholesterol: 116 mg/dL — ABNORMAL HIGH (ref 0–99)
NonHDL: 136.73
Total CHOL/HDL Ratio: 3
Triglycerides: 106 mg/dL (ref 0.0–149.0)
VLDL: 21.2 mg/dL (ref 0.0–40.0)

## 2020-10-09 DIAGNOSIS — Z23 Encounter for immunization: Secondary | ICD-10-CM | POA: Diagnosis not present

## 2020-11-26 ENCOUNTER — Other Ambulatory Visit: Payer: Self-pay | Admitting: Physician Assistant

## 2020-11-26 DIAGNOSIS — F411 Generalized anxiety disorder: Secondary | ICD-10-CM

## 2020-11-26 DIAGNOSIS — F259 Schizoaffective disorder, unspecified: Secondary | ICD-10-CM

## 2020-11-26 NOTE — Telephone Encounter (Signed)
Pt's mom [name] called asking you to send refills for all meds sent 10/19. [facility] on file did not receive. They gave confirmation, but they don't have them. Apt 1/17. Pt is going out of town and will need to pick up tomorrow.

## 2020-12-04 ENCOUNTER — Telehealth (INDEPENDENT_AMBULATORY_CARE_PROVIDER_SITE_OTHER): Payer: Medicare Other | Admitting: Physician Assistant

## 2020-12-04 ENCOUNTER — Encounter: Payer: Self-pay | Admitting: Physician Assistant

## 2020-12-04 DIAGNOSIS — F259 Schizoaffective disorder, unspecified: Secondary | ICD-10-CM | POA: Diagnosis not present

## 2020-12-04 DIAGNOSIS — F411 Generalized anxiety disorder: Secondary | ICD-10-CM

## 2020-12-04 DIAGNOSIS — F17200 Nicotine dependence, unspecified, uncomplicated: Secondary | ICD-10-CM

## 2020-12-04 DIAGNOSIS — F99 Mental disorder, not otherwise specified: Secondary | ICD-10-CM

## 2020-12-04 DIAGNOSIS — F5105 Insomnia due to other mental disorder: Secondary | ICD-10-CM | POA: Diagnosis not present

## 2020-12-04 MED ORDER — ALPRAZOLAM 1 MG PO TABS: ORAL_TABLET | ORAL | 1 refills | Status: DC

## 2020-12-04 NOTE — Progress Notes (Signed)
[facility]  Patient ID: Reginald Dunn,  MRN: [id number]  PCP: Sharlene Dory, DO  Date of Evaluation: 12/04/2020 Time spent:30 minutes  Chief Complaint:  Chief Complaint    Anxiety; Depression; Insomnia     Virtual Visit via Telehealth  I connected with patient by a video enabled telemedicine application with their informed consent, and verified patient privacy and that I am speaking with the correct person using two identifiers.  I am private, in my office and the patient is at home.  I discussed the limitations, risks, security and privacy concerns of performing an evaluation and management service by video and the availability of in person appointments. I also discussed with the patient that there may be a patient responsible charge related to this service. The patient expressed understanding and agreed to proceed.   I discussed the assessment and treatment plan with the patient. The patient was provided an opportunity to ask questions and all were answered. The patient agreed with the plan and demonstrated an understanding of the instructions.   The patient was advised to call back or seek an in-person evaluation if the symptoms worsen or if the condition fails to improve as anticipated.  I provided 30 minutes of non-face-to-face time during this encounter.  HISTORY/CURRENT STATUS: HPI for 54-month med check.  Mom, [name], is also on call.  Martavious states he is doing well.  He does have days where he feels a little down but overall he feels that the medications are working well and does not want to make any changes.  He is able to enjoy things.  Energy and motivation are good.  Appetite is normal.  Denies suicidal or homicidal thoughts.  Anxiety is controlled.  He is not always needing the Xanax 3 times a day.  It is effective when needed.  He has more of a generalized sense of something bad happening than panic attacks.  He sleeps well as long as he has the  Seroquel and Risperdal.  He continues to smoke cigarettes.  He would like to quit but states it is too hard right now.  Patient denies increased energy with decreased need for sleep, no increased talkativeness, no racing thoughts, no impulsivity or risky behaviors, no increased spending, no increased libido, no grandiosity, no increased irritability or anger, specifically he denies paranoia and no hallucinations.  Denies dizziness, syncope, seizures, numbness, tingling, tremor, tics, unsteady gait, slurred speech, confusion. Denies muscle or joint pain, stiffness, or dystonia. Denies unexplained weight loss, frequent infections, or sores that heal slowly.  No polyphagia, polydipsia, or polyuria. Denies visual changes or paresthesias.   Individual Medical History/ Review of Systems: Changes? :No    Past medications for mental health diagnoses include: Pristiq, Celexa, lithium, Depakote, carbamazepine, Ativan, Ambien, gabapentin, Ritalin, Vyvanse, Zoloft, Wellbutrin, Latuda, Seroquel, Zyprexa, Risperdal, Lamictal, Vistaril, BuSpar, clozapine, Klonopin, Paxil  Allergies: Patient has no known allergies.  Current Medications:  Current Outpatient Medications:  .  albuterol (VENTOLIN HFA) 108 (90 Base) MCG/ACT inhaler, Inhale 1 puff into the lungs every 6 (six) hours as needed for wheezing or shortness of breath., Disp: 18 g, Rfl: 0 .  gabapentin (NEURONTIN) 600 MG tablet, TAKE 2 TABLETS BY MOUTH IN THE MORNING , 2 TABLETS BY MOUTH EVERY AFTERNOON, AND 2 TABLETS BY MOUTH EVERY NIGHT AT BEDTIME, Disp: 180 tablet, Rfl: 2 .  Multiple Vitamins-Minerals (CENTRUM VITAMINTS PO), Take by mouth daily., Disp: , Rfl:  .  PARoxetine (PAXIL) 20 MG tablet, TAKE 1 TABLET BY  MOUTH DAILY, Disp: 30 tablet, Rfl: 2 .  QUEtiapine (SEROQUEL) 400 MG tablet, TAKE 2 AND 1/2 TABLETS BY MOUTH AT BEDTIME, Disp: 75 tablet, Rfl: 2 .  risperiDONE (RISPERDAL) 1 MG tablet, TAKE 5 TABLETS BY MOUTH AT BEDTIME, Disp: 150 tablet, Rfl: 2 .   ALPRAZolam (XANAX) 1 MG tablet, TAKE 1 TABLET BY MOUTH 3 TIMES DAILY AS NEEDED FOR ANXIETY, Disp: 90 tablet, Rfl: 1 Medication Side Effects: none  Family Medical/ Social History: Changes? No  MENTAL HEALTH EXAM:  There were no vitals taken for this visit.There is no height or weight on file to calculate BMI.  General Appearance: Casual, Neat and Well Groomed  Eye Contact:  Good  Speech:  Clear and Coherent and Normal Rate  Volume:  Normal  Mood:  Euthymic  Affect:  Appropriate  Thought Process:  Goal Directed and Descriptions of Associations: Intact  Orientation:  Full (Time, Place, and Person)  Thought Content: Logical   Suicidal Thoughts:  No  Homicidal Thoughts:  No  Memory:  WNL  Judgement:  Good  Insight:  Good  Psychomotor Activity:  Normal  Concentration:  Concentration: Good  Recall:  Good  Fund of Knowledge: Good  Language: Good  Assets:  Desire for Improvement  ADL's:  Intact  Cognition: WNL  Prognosis:  Good   Most recent pertinent labs  09/26/2020 Total cholesterol 198, triglycerides 106, HDL 62.20, LDL 116. 02/26/2020  CMP was completely normal, specifically glucose was 98.  DIAGNOSES:    ICD-10-CM   1. Schizoaffective disorder, unspecified type (HCC)  F25.9   2. Generalized anxiety disorder  F41.1 ALPRAZolam (XANAX) 1 MG tablet  3. Smoker  F17.200   4. Insomnia due to other mental disorder  F51.05    F99     Receiving Psychotherapy: No    RECOMMENDATIONS:  PDMP was reviewed. I provided 30 minutes of nonface-to-face time during this encounter, including lab review, smoking cessation counseling, and speaking with his mom about trying to get the medications in sync. He is doing well for the most part.  No medication changes at this time. We again discussed smoking cessation.  He states he is not ready to quit right now but will let me know if/when he is and I will do my best to help him. Continue Xanax 1 mg, 1 p.o. 3 times daily as needed  anxiety. Continue gabapentin 600 mg, 2 p.o. 3 times daily. Continue Paxil 20 mg, 1 p.o. daily. Continue Seroquel 400 mg, 2.5 pills nightly. Continue Risperdal 1 mg, 5 p.o. nightly. I have asked his mom to call here when he needs refills, not the pharmacy, because it seems more difficult getting his medication refills in sync.  Hopefully the way they have been prescribed last week and then today's prescription of Xanax to have on hold, things will be in sync now. Return in 3 months.  Melony Overly, PA-C

## 2020-12-31 ENCOUNTER — Telehealth: Payer: Self-pay | Admitting: Physician Assistant

## 2020-12-31 NOTE — Telephone Encounter (Signed)
Pt mother called and said that the Risperdal is not a covered med now that he is with [facility]. They are asking for approval for a quantity of 150. She also said that she dropped off the letter that they had sent and would like to talk to you about it.

## 2020-12-31 NOTE — Telephone Encounter (Signed)
Please call his Mom and recommend [facility]. You and I discussed this earlier and I apologize for not getting back to you on it. I have the letter she's referring to, if you need it.  If I need to send in a new Rx, let me know.

## 2021-01-01 NOTE — Telephone Encounter (Signed)
Rtc to [place], Mom and discussed [facility], she agreed to do that route. [facility] is the cheapest and that's what they use anyway. Advised her to call back with further issues.

## 2021-02-22 ENCOUNTER — Other Ambulatory Visit: Payer: Self-pay | Admitting: Physician Assistant

## 2021-02-22 DIAGNOSIS — F259 Schizoaffective disorder, unspecified: Secondary | ICD-10-CM

## 2021-02-22 DIAGNOSIS — F411 Generalized anxiety disorder: Secondary | ICD-10-CM

## 2021-02-24 NOTE — Telephone Encounter (Signed)
Please review

## 2021-03-04 ENCOUNTER — Encounter: Payer: Self-pay | Admitting: Physician Assistant

## 2021-03-04 ENCOUNTER — Telehealth (INDEPENDENT_AMBULATORY_CARE_PROVIDER_SITE_OTHER): Payer: Medicare Other | Admitting: Physician Assistant

## 2021-03-04 DIAGNOSIS — F259 Schizoaffective disorder, unspecified: Secondary | ICD-10-CM

## 2021-03-04 DIAGNOSIS — F17200 Nicotine dependence, unspecified, uncomplicated: Secondary | ICD-10-CM

## 2021-03-04 DIAGNOSIS — F4010 Social phobia, unspecified: Secondary | ICD-10-CM

## 2021-03-04 DIAGNOSIS — F411 Generalized anxiety disorder: Secondary | ICD-10-CM | POA: Diagnosis not present

## 2021-03-04 DIAGNOSIS — F5105 Insomnia due to other mental disorder: Secondary | ICD-10-CM

## 2021-03-04 DIAGNOSIS — F99 Mental disorder, not otherwise specified: Secondary | ICD-10-CM | POA: Diagnosis not present

## 2021-03-04 MED ORDER — RISPERIDONE 4 MG PO TABS: 4.0000 mg | ORAL_TABLET | Freq: Every day | ORAL | 2 refills | Status: DC

## 2021-03-04 NOTE — Progress Notes (Signed)
[facility]  Patient ID: Reginald Dunn,  MRN: [id number]  PCP: Sharlene Dory, DO  Date of Evaluation: 03/04/2021 Time spent:30 minutes  Chief Complaint:  Chief Complaint    Anxiety; Depression; Follow-up      Virtual Visit via Telehealth  I connected with patient by a video enabled telemedicine application with their informed consent, and verified patient privacy and that I am speaking with the correct person using two identifiers.  I am private, in my office and the patient is at home.  Video connection was not stable, therefore after approx 10 minutes, we had to resort to phone visit.  I discussed the limitations, risks, security and privacy concerns of performing an evaluation and management service by video and the availability of in person appointments. I also discussed with the patient that there may be a patient responsible charge related to this service. The patient expressed understanding and agreed to proceed.    I discussed the assessment and treatment plan with the patient. The patient was provided an opportunity to ask questions and all were answered. The patient agreed with the plan and demonstrated an understanding of the instructions.   The patient was advised to call back or seek an in-person evaluation if the symptoms worsen or if the condition fails to improve as anticipated.  I provided  30 minutes of non-face-to-face time during this encounter.   HISTORY/CURRENT STATUS: HPI For 3 month med check. Mom, Reginald Dunn is with him.  Reginald Dunn states he is doing well.  Medications are helping as far as his mood goes.  He is able to enjoy things.  Sleeps well most of the time.  Energy and motivation are good.  Appetite and weight are stable.  He is still extremely nervous to get out in public since the pandemic started.  He will get out if he absolutely has to.  He does have panic attacks in situations that require being around people.  The Xanax is still  helpful.  No suicidal or homicidal thoughts.  No manic symptoms of increased energy with decreased need for sleep.  No impulsivity or risky behavior.  No increased spending, grandiosity, paranoia, or hallucinations.  The only problem is that his insurance will not pay for Risperdal as it is written, 1 mg taking 5 pills nightly.  This has been an ongoing battle since the first of the year.  Denies dizziness, syncope, seizures, numbness, tingling, tremor, tics, unsteady gait, slurred speech, confusion. Denies muscle or joint pain, stiffness, or dystonia.  Individual Medical History/ Review of Systems: Changes? :No   Past medications for mental health diagnoses include: Pristiq, Celexa, lithium, Depakote, carbamazepine, Ativan, Ambien, gabapentin, Ritalin, Vyvanse, Zoloft, Wellbutrin, Latuda, Seroquel, Zyprexa, Risperdal, Lamictal, Vistaril, BuSpar, clozapine, Klonopin, Paxil  Allergies: Patient has no known allergies.  Current Medications:  Current Outpatient Medications:  .  albuterol (VENTOLIN HFA) 108 (90 Base) MCG/ACT inhaler, Inhale 1 puff into the lungs every 6 (six) hours as needed for wheezing or shortness of breath., Disp: 18 g, Rfl: 0 .  ALPRAZolam (XANAX) 1 MG tablet, TAKE ONE TABLET BY MOUTH THREE TIMES A DAY AS NEEDED FOR ANXIETY, Disp: 90 tablet, Rfl: 2 .  gabapentin (NEURONTIN) 600 MG tablet, TAKE 2 TABLETS BY MOUTH IN THE MORNING , 2 TABLETS BY MOUTH EVERY AFTERNOON, AND 2 TABLETS BY MOUTH EVERY NIGHT AT BEDTIME, Disp: 180 tablet, Rfl: 2 .  Multiple Vitamins-Minerals (CENTRUM VITAMINTS PO), Take by mouth daily., Disp: , Rfl:  .  PARoxetine (PAXIL)  20 MG tablet, TAKE ONE TABLET BY MOUTH DAILY, Disp: 30 tablet, Rfl: 2 .  QUEtiapine (SEROQUEL) 400 MG tablet, TAKE 2 AND 1/2 TABLETS BY MOUTH AT BEDTIME, Disp: 75 tablet, Rfl: 2 .  risperidone (RISPERDAL) 4 MG tablet, Take 1 tablet (4 mg total) by mouth daily., Disp: 30 tablet, Rfl: 2 Medication Side Effects: none  Family Medical/  Social History: Changes? No  MENTAL HEALTH EXAM:  There were no vitals taken for this visit.There is no height or weight on file to calculate BMI.  General Appearance: Unable to assess  Eye Contact:  Unable to assess  Speech:  Clear and Coherent and Normal Rate  Volume:  Normal  Mood:  Euthymic  Affect:  Unable to assess  Thought Process:  Goal Directed and Descriptions of Associations: Circumstantial  Orientation:  Full (Time, Place, and Person)  Thought Content: Rumination   Suicidal Thoughts:  No  Homicidal Thoughts:  No  Memory:  WNL  Judgement:  Good  Insight:  Good  Psychomotor Activity:  Unable to assess  Concentration:  Concentration: Good  Recall:  Good  Fund of Knowledge: Good  Language: Good  Assets:  Desire for Improvement  ADL's:  Intact  Cognition: WNL  Prognosis:  Good   Labs 02/26/2020 CMP was normal, specifically glucose 98. 09/26/2020 Lipid profile total cholesterol 198, triglycerides 106, HDL 61, LDL 116.  DIAGNOSES:    ICD-10-CM   1. Schizoaffective disorder, unspecified type (HCC)  F25.9   2. Social anxiety disorder  F40.10   3. Insomnia due to other mental disorder  F51.05    F99   4. Smoker  F17.200   5. Generalized anxiety disorder  F41.1     Receiving Psychotherapy: No    RECOMMENDATIONS:  PDMP was reviewed. I provided 30 minutes of non face-to-face time during this encounter, including time spent before and after the visit and records review and charting. Concerning the quantity limit of Risperdal, I suggest either of any of the following option to equal 5 mg: 3 mg +2 mg pills daily, 4 mg +1 mg daily, or we could decrease down to 4 mg or increase up to 6 mg if needed.  We agree to decrease to 4 mg just for 1 month and see how he does.  If he is doing well then he will stay on that dose.  If not then we will decide the next course of action.  Patient's mom is concerned that his insurance will not allow that to be filled but I have assured her  there is no reason for them not to, after the current supply (30 days' worth of the 1 mg they recently picked up) is gone.  If the insurance or pharmacist has a problem with that, then I will either call the pharmacist or write a letter to the insurance company explaining the situation. Smoking cessation was discussed. Decrease Risperdal to 4 mg p.o. nightly. Continue Seroquel 400 mg, 2.5 pills nightly. Continue Paxil 20 mg daily. Continue gabapentin 600 mg, 2 p.o. every morning, q. afternoon, and nightly. Continue Xanax 1 mg, 1 p.o. 3 times daily as needed anxiety. Return in 3 months.  Melony Overly, PA-C

## 2021-05-21 DIAGNOSIS — Z20822 Contact with and (suspected) exposure to covid-19: Secondary | ICD-10-CM | POA: Diagnosis not present

## 2021-05-25 ENCOUNTER — Other Ambulatory Visit: Payer: Self-pay | Admitting: Physician Assistant

## 2021-05-25 DIAGNOSIS — F259 Schizoaffective disorder, unspecified: Secondary | ICD-10-CM

## 2021-05-25 DIAGNOSIS — F411 Generalized anxiety disorder: Secondary | ICD-10-CM

## 2021-05-26 NOTE — Telephone Encounter (Signed)
Please review

## 2021-05-30 ENCOUNTER — Telehealth: Payer: Self-pay | Admitting: Family Medicine

## 2021-06-02 ENCOUNTER — Telehealth (INDEPENDENT_AMBULATORY_CARE_PROVIDER_SITE_OTHER): Payer: Medicare Other | Admitting: Physician Assistant

## 2021-06-02 ENCOUNTER — Encounter: Payer: Self-pay | Admitting: Physician Assistant

## 2021-06-02 DIAGNOSIS — F411 Generalized anxiety disorder: Secondary | ICD-10-CM | POA: Diagnosis not present

## 2021-06-02 DIAGNOSIS — F259 Schizoaffective disorder, unspecified: Secondary | ICD-10-CM

## 2021-06-02 DIAGNOSIS — F17200 Nicotine dependence, unspecified, uncomplicated: Secondary | ICD-10-CM | POA: Diagnosis not present

## 2021-06-02 DIAGNOSIS — F330 Major depressive disorder, recurrent, mild: Secondary | ICD-10-CM | POA: Diagnosis not present

## 2021-06-02 MED ORDER — PAROXETINE HCL 10 MG PO TABS: 10.0000 mg | ORAL_TABLET | Freq: Every day | ORAL | 0 refills | Status: DC

## 2021-06-02 MED ORDER — RISPERIDONE 1 MG PO TABS: 1.0000 mg | ORAL_TABLET | Freq: Every day | ORAL | 0 refills | Status: DC

## 2021-06-02 NOTE — Progress Notes (Signed)
[facility]  Patient ID: Reginald Dunn,  MRN: [id number]  PCP: Sharlene Dory, DO  Date of Evaluation: 06/02/2021 Time spent:30 minutes  Chief Complaint:  Chief Complaint   Follow-up; Schizoaffective disorder, unspecified type [facility])     Virtual Visit via Telehealth  I connected with patient by a telephone with their informed consent, and verified patient privacy and that I am speaking with the correct person using two identifiers.  I am private, in my office and the patient is at home.  Video connection was not working.  Neither of [place] could hear each other so we resorted to phone visit.  I discussed the limitations, risks, security and privacy concerns of performing an evaluation and management service by telephone and the availability of in person appointments. I also discussed with the patient that there may be a patient responsible charge related to this service. The patient expressed understanding and agreed to proceed.    I discussed the assessment and treatment plan with the patient. The patient was provided an opportunity to ask questions and all were answered. The patient agreed with the plan and demonstrated an understanding of the instructions.   The patient was advised to call back or seek an in-person evaluation if the symptoms worsen or if the condition fails to improve as anticipated.  I provided 30 minutes of non-face-to-face time during this encounter.   HISTORY/CURRENT STATUS: HPI For 3 month med check. Mom, Reginald Dunn is with him.  Reginald Dunn has been doing well except for feeling kind of blue the past month or so.  No real reason to be depressed.  He does watch TV but that is about the only thing he does for enjoyment.  That really is no different however.  Does not have a lot of motivation or energy.  He does not cry easily.  Just feels blah.  Sleeps well.  Anxiety is well controlled.  Xanax continues to help that.  No suicidal or homicidal  thoughts.  Patient denies increased energy with decreased need for sleep, no increased talkativeness, no racing thoughts, no impulsivity or risky behaviors, no increased spending, no increased libido, no grandiosity, no increased irritability or anger, no paranoia, and no hallucinations.  Denies dizziness, syncope, seizures, numbness, tingling, tremor, tics, unsteady gait, slurred speech, confusion. Denies muscle or joint pain, stiffness, or dystonia.  Individual Medical History/ Review of Systems: Changes? :No   Past medications for mental health diagnoses include: Pristiq, Celexa, lithium, Depakote, carbamazepine, Ativan, Ambien, gabapentin, Ritalin, Vyvanse, Zoloft, Wellbutrin, Latuda, Seroquel, Zyprexa, Risperdal, Lamictal, Vistaril, BuSpar, clozapine, Klonopin, Paxil  Allergies: Patient has no known allergies.  Current Medications:  Current Outpatient Medications:    albuterol (VENTOLIN HFA) 108 (90 Base) MCG/ACT inhaler, Inhale 1 puff into the lungs every 6 (six) hours as needed for wheezing or shortness of breath., Disp: 18 g, Rfl: 0   ALPRAZolam (XANAX) 1 MG tablet, TAKE ONE TABLET BY MOUTH THREE TIMES A DAY AS NEEDED FOR ANXIETY, Disp: 90 tablet, Rfl: 2   gabapentin (NEURONTIN) 600 MG tablet, TAKE 2 TABLETS BY MOUTH IN THE MORNING, 2 TABLETS BY MOUTH EVERY AFTERNOON , AND 2 TABLETS BY MOUTH EVERY NIGHT AT BEDTIME, Disp: 180 tablet, Rfl: 2   Multiple Vitamins-Minerals (CENTRUM VITAMINTS PO), Take by mouth daily., Disp: , Rfl:    PARoxetine (PAXIL) 10 MG tablet, Take 1 tablet (10 mg total) by mouth daily. With the 20 mg tablet=30 mg., Disp: 90 tablet, Rfl: 0   PARoxetine (PAXIL) 20 MG tablet,  TAKE ONE TABLET BY MOUTH DAILY, Disp: 30 tablet, Rfl: 2   QUEtiapine (SEROQUEL) 400 MG tablet, TAKE 2 AND 1/2 TABLETS BY MOUTH EVERY NIGHT AT BEDTIME, Disp: 75 tablet, Rfl: 2   risperiDONE (RISPERDAL) 1 MG tablet, Take 1 tablet (1 mg total) by mouth at bedtime., Disp: 450 tablet, Rfl: 0 Medication  Side Effects: none  Family Medical/ Social History: Changes? No  MENTAL HEALTH EXAM:  There were no vitals taken for this visit.There is no height or weight on file to calculate BMI.  General Appearance:  Unable to assess  Eye Contact:   Unable to assess  Speech:  Clear and Coherent and Normal Rate  Volume:  Normal  Mood:  Depressed  Affect:   Unable to assess  Thought Process:  Goal Directed and Descriptions of Associations: Circumstantial  Orientation:  Full (Time, Place, and Person)  Thought Content: Rumination   Suicidal Thoughts:  No  Homicidal Thoughts:  No  Memory:  WNL  Judgement:  Good  Insight:  Good  Psychomotor Activity:   Unable to assess  Concentration:  Concentration: Good  Recall:  Good  Fund of Knowledge: Good  Language: Good  Assets:  Desire for Improvement  ADL's:  Intact  Cognition: WNL  Prognosis:  Good   Labs 02/26/2020 CMP was normal, specifically glucose 98. 09/26/2020 Lipid profile total cholesterol 198, triglycerides 106, HDL 61, LDL 116.  DIAGNOSES:    ICD-10-CM   1. Mild episode of recurrent major depressive disorder (HCC)  F33.0     2. Schizoaffective disorder, unspecified type (HCC)  F25.9     3. Generalized anxiety disorder  F41.1     4. Smoker  F17.200        Receiving Psychotherapy: No    RECOMMENDATIONS:  PDMP was reviewed. Last Xanax 05/26/21. I provided 30 minutes of nonface-to-face time during this encounter, including time spent before and after the visit in records review, medical decision making, and charting. We discussed the depression.  He has been on Paxil for quite a while at the same dose.  It has been effective but I think increasing the dose is a good idea.  He and his mom agree. Continue Risperdal 5 mg qhs.  (He has 4 mg +1 mg pills right now and will finish those out.  His insurance company did approve 1 mg pills x5 daily so new prescription sent for that.) Continue Seroquel 400 mg, 2.5 pills nightly. Increase  Paxil to 30 mg daily. Continue gabapentin 600 mg, 2 p.o. every morning, q. afternoon, and nightly. Continue Xanax 1 mg, 1 p.o. 3 times daily as needed anxiety. Order labs at next visit or he will have PCP to draw them. Return in 3 months.  Melony Overly, PA-C

## 2021-06-24 ENCOUNTER — Other Ambulatory Visit: Payer: Self-pay

## 2021-06-24 ENCOUNTER — Encounter: Payer: Self-pay | Admitting: Family Medicine

## 2021-06-24 ENCOUNTER — Other Ambulatory Visit: Payer: Self-pay | Admitting: Family Medicine

## 2021-06-24 ENCOUNTER — Ambulatory Visit (INDEPENDENT_AMBULATORY_CARE_PROVIDER_SITE_OTHER): Payer: Medicare Other | Admitting: Family Medicine

## 2021-06-24 VITALS — BP 110/74 | HR 89 | Temp 98.0°F | Ht 71.0 in | Wt 167.4 lb

## 2021-06-24 DIAGNOSIS — E785 Hyperlipidemia, unspecified: Secondary | ICD-10-CM

## 2021-06-24 DIAGNOSIS — Z1159 Encounter for screening for other viral diseases: Secondary | ICD-10-CM | POA: Diagnosis not present

## 2021-06-24 DIAGNOSIS — Z0000 Encounter for general adult medical examination without abnormal findings: Secondary | ICD-10-CM

## 2021-06-24 DIAGNOSIS — N179 Acute kidney failure, unspecified: Secondary | ICD-10-CM

## 2021-06-24 DIAGNOSIS — Z114 Encounter for screening for human immunodeficiency virus [HIV]: Secondary | ICD-10-CM | POA: Diagnosis not present

## 2021-06-24 LAB — COMPREHENSIVE METABOLIC PANEL
ALT: 16 U/L (ref 0–53)
AST: 12 U/L (ref 0–37)
Albumin: 4.7 g/dL (ref 3.5–5.2)
Alkaline Phosphatase: 70 U/L (ref 39–117)
BUN: 19 mg/dL (ref 6–23)
CO2: 25 mEq/L (ref 19–32)
Calcium: 11.8 mg/dL — ABNORMAL HIGH (ref 8.4–10.5)
Chloride: 102 mEq/L (ref 96–112)
Creatinine, Ser: 1.99 mg/dL — ABNORMAL HIGH (ref 0.40–1.50)
GFR: 41.06 mL/min — ABNORMAL LOW (ref >60.00)
Glucose, Bld: 94 mg/dL (ref 70–99)
Potassium: 4.7 mEq/L (ref 3.5–5.1)
Sodium: 135 mEq/L (ref 135–145)
Total Bilirubin: 0.4 mg/dL (ref 0.2–1.2)
Total Protein: 7.1 g/dL (ref 6.0–8.3)

## 2021-06-24 LAB — LIPID PANEL
Cholesterol: 206 mg/dL — ABNORMAL HIGH (ref 0–200)
HDL: 64.1 mg/dL (ref >39.00)
LDL Cholesterol: 124 mg/dL — ABNORMAL HIGH (ref 0–99)
NonHDL: 142.39
Total CHOL/HDL Ratio: 3
Triglycerides: 93 mg/dL (ref 0.0–149.0)
VLDL: 18.6 mg/dL (ref 0.0–40.0)

## 2021-06-24 NOTE — Patient Instructions (Addendum)
Give us 2-3 business days to get the results of your labs back.   Keep the diet clean and stay active.  I recommend getting the flu shot in mid October. This suggestion would change if the CDC comes out with a different recommendation.   Let us know if you need anything. 

## 2021-06-24 NOTE — Progress Notes (Signed)
Subjective:   Reginald Dunn is a 40 y.o. male who presents for Medicare Annual/Subsequent preventive examination.  Review of Systems    10 pt ROS neg       Objective:    Today's Vitals   06/24/21 0913  BP: 110/74  Pulse: 89  Temp: 98 F (36.7 C)  TempSrc: Oral  SpO2: 98%  Weight: 167 lb 6 oz (75.9 kg)  Height: 5\' 11"  (1.803 m)   Body mass index is 23.34 kg/m.  Advanced Directives 12/13/2018  Does Patient Have a Medical Advance Directive? No  Would patient like information on creating a medical advance directive? No - Patient declined    Current Medications (verified) Outpatient Encounter Medications as of 06/24/2021  Medication Sig   albuterol (VENTOLIN HFA) 108 (90 Base) MCG/ACT inhaler Inhale 1 puff into the lungs every 6 (six) hours as needed for wheezing or shortness of breath.   ALPRAZolam (XANAX) 1 MG tablet TAKE ONE TABLET BY MOUTH THREE TIMES A DAY AS NEEDED FOR ANXIETY   gabapentin (NEURONTIN) 600 MG tablet TAKE 2 TABLETS BY MOUTH IN THE MORNING, 2 TABLETS BY MOUTH EVERY AFTERNOON , AND 2 TABLETS BY MOUTH EVERY NIGHT AT BEDTIME   Multiple Vitamins-Minerals (CENTRUM VITAMINTS PO) Take by mouth daily.   PARoxetine (PAXIL) 10 MG tablet Take 1 tablet (10 mg total) by mouth daily. With the 20 mg tablet=30 mg.   PARoxetine (PAXIL) 20 MG tablet TAKE ONE TABLET BY MOUTH DAILY   QUEtiapine (SEROQUEL) 400 MG tablet TAKE 2 AND 1/2 TABLETS BY MOUTH EVERY NIGHT AT BEDTIME   risperiDONE (RISPERDAL) 1 MG tablet Take 1 tablet (1 mg total) by mouth at bedtime.    Allergies (verified) Patient has no known allergies.   History: Past Medical History:  Diagnosis Date   ADHD    Allergy    seasonal   Blood transfusion without reported diagnosis    2017   Depression    GERD (gastroesophageal reflux disease)    Social anxiety disorder    Past Surgical History:  Procedure Laterality Date   HIATAL HERNIA REPAIR  08/2016   Family History  Problem Relation Age of Onset    Colon cancer Neg Hx    Colon polyps Neg Hx    Esophageal cancer Neg Hx    Pancreatic cancer Neg Hx    Prostate cancer Neg Hx    Rectal cancer Neg Hx    Social History   Socioeconomic History   Marital status: Single  Tobacco Use   Smoking status: Every Day    Packs/day: 1.00    Types: Cigarettes   Smokeless tobacco: Never  Vaping Use   Vaping Use: Never used  Substance and Sexual Activity   Alcohol use: Not Currently    Alcohol/week: 1.0 standard drink    Types: 1 Cans of beer per week    Comment: socially q3 months   Drug use: Never    Tobacco Counseling Ready to quit: No Counseling given: Yes  Diabetic? No   Activities of Daily Living No issues bathing, going to restroom, preparing meals, balance finances, doing chores.   Patient Care Team: 09/2016, DO as PCP - General (Family Medicine)  Indicate any recent Medical Services you may have received from other than [facility] providers in the past year (date may be approximate).     Assessment:   This is a routine wellness examination for Lazar.  Hearing/Vision screen Not done  Dietary issues and exercise activities discussed:  Yes.   Goals Addressed   None    Depression Screen PHQ 2/9 Scores 06/24/2021  PHQ - 2 Score 0  PHQ- 9 Score 0    Fall Risk Fall Risk  06/24/2021  Falls in the past year? 0  Number falls in past yr: 0  Injury with Fall? 0  Risk for fall due to : No Fall Risks  Follow up Falls evaluation completed    FALL RISK PREVENTION PERTAINING TO THE HOME:  Any stairs in or around the home? Yes  If so, are there any without handrails? Yes  Home free of loose throw rugs in walkways, pet beds, electrical cords, etc? Yes  Adequate lighting in your home to reduce risk of falls? Yes   ASSISTIVE DEVICES UTILIZED TO PREVENT FALLS:  Life alert? No  Use of a cane, walker or w/c? No  Grab bars in the bathroom? No  Shower chair or bench in shower? No  Elevated toilet seat or a  handicapped toilet? No   TIMED UP AND GO:  Was the test performed? No .  Pt ambulating well, not 65.   Gait steady and fast without use of assistive device  Cognitive Function: Alert to person, place, time, president  Immunizations Immunization History  Administered Date(s) Administered   PFIZER(Purple Top)SARS-COV-2 Vaccination 02/29/2020, 03/25/2020, 10/06/2020    TDAP status: Not done  Will get flu shot this fall.   Covid-19 vaccine status: Completed vaccines  Qualifies for Shingles Vaccine? No    Screening Tests Health Maintenance  Topic Date Due   Pneumococcal Vaccine 93-71 Years old (1 - PCV) Never done   HIV Screening  Never done   Hepatitis C Screening  Never done   COVID-19 Vaccine (4 - Booster for Pfizer series) 01/06/2021   INFLUENZA VACCINE  06/16/2021   HPV VACCINES  Aged Out   TETANUS/TDAP  Discontinued    Health Maintenance  Health Maintenance Due  Topic Date Due   Pneumococcal Vaccine 88-27 Years old (1 - PCV) Never done   HIV Screening  Never done   Hepatitis C Screening  Never done   COVID-19 Vaccine (4 - Booster for Pfizer series) 01/06/2021   INFLUENZA VACCINE  06/16/2021    Colorectal cancer screening: Not currently required.   Lung Cancer Screening: (Low Dose CT Chest recommended if Age 71-80 years, 30 pack-year currently smoking OR have quit w/in 15years.) does not qualify.   Lung Cancer Screening Referral: N/A  Additional Screening:  Hepatitis C Screening: does qualify; Completed today  Vision Screening: Recommended annual ophthalmology exams for early detection of glaucoma and other disorders of the eye. Is the patient up to date with their annual eye exam?  No  Who is the provider or what is the name of the office in which the patient attends annual eye exams? N/A If pt is not established with a provider, would they like to be referred to a provider to establish care? No .   Dental Screening: Recommended annual dental exams for  proper oral hygiene  Community Resource Referral / Chronic Care Management: CRR required this visit?  No   CCM required this visit?  No      Plan:     I have personally reviewed and noted the following in the patient's chart:   Medical and social history Use of alcohol, tobacco or illicit drugs  Current medications and supplements including opioid prescriptions. Patient is not currently taking opioid prescriptions. Functional ability and status Nutritional status Physical activity  Advanced directives List of other physicians Hospitalizations, surgeries, and ER visits in previous 12 months Vitals Screenings to include cognitive, depression, and falls- this is less applicable to him at this point Referrals and appointments  In addition, I have reviewed and discussed with patient certain preventive protocols, quality metrics, and best practice recommendations. A written personalized care plan for preventive services as well as general preventive health recommendations were provided to patient.     Jilda Roche [place], DO   06/24/2021

## 2021-06-25 LAB — HIV ANTIBODY (ROUTINE TESTING W REFLEX): HIV 1&2 Ab, 4th Generation: NONREACTIVE

## 2021-06-25 LAB — HEPATITIS C ANTIBODY
Hepatitis C Ab: NONREACTIVE
SIGNAL TO CUT-OFF: 0.01 (ref <1.00)

## 2021-07-16 ENCOUNTER — Other Ambulatory Visit: Payer: Self-pay

## 2021-07-16 ENCOUNTER — Ambulatory Visit (INDEPENDENT_AMBULATORY_CARE_PROVIDER_SITE_OTHER): Payer: Medicare Other | Admitting: *Deleted

## 2021-07-16 ENCOUNTER — Other Ambulatory Visit (INDEPENDENT_AMBULATORY_CARE_PROVIDER_SITE_OTHER): Payer: Medicare Other

## 2021-07-16 DIAGNOSIS — Z0100 Encounter for examination of eyes and vision without abnormal findings: Secondary | ICD-10-CM

## 2021-07-16 DIAGNOSIS — N179 Acute kidney failure, unspecified: Secondary | ICD-10-CM | POA: Diagnosis not present

## 2021-07-16 DIAGNOSIS — Z0110 Encounter for examination of ears and hearing without abnormal findings: Secondary | ICD-10-CM | POA: Diagnosis not present

## 2021-07-16 DIAGNOSIS — Z01020 Encounter for examination of eyes and vision following failed vision screening without abnormal findings: Secondary | ICD-10-CM

## 2021-07-16 LAB — COMPREHENSIVE METABOLIC PANEL
ALT: 13 U/L (ref 0–53)
AST: 14 U/L (ref 0–37)
Albumin: 4.6 g/dL (ref 3.5–5.2)
Alkaline Phosphatase: 72 U/L (ref 39–117)
BUN: 13 mg/dL (ref 6–23)
CO2: 26 mEq/L (ref 19–32)
Calcium: 10.6 mg/dL — ABNORMAL HIGH (ref 8.4–10.5)
Chloride: 102 mEq/L (ref 96–112)
Creatinine, Ser: 1.5 mg/dL (ref 0.40–1.50)
GFR: 57.62 mL/min — ABNORMAL LOW (ref >60.00)
Glucose, Bld: 81 mg/dL (ref 70–99)
Potassium: 4.2 mEq/L (ref 3.5–5.1)
Sodium: 137 mEq/L (ref 135–145)
Total Bilirubin: 0.4 mg/dL (ref 0.2–1.2)
Total Protein: 6.9 g/dL (ref 6.0–8.3)

## 2021-07-16 NOTE — Progress Notes (Signed)
Patient here today for vision and hearing screening per PCP order.

## 2021-07-19 LAB — PTH, INTACT (ICMA) AND IONIZED CALCIUM
Calcium: 10.2 mg/dL (ref 8.6–10.3)
PTH: <6 pg/mL — ABNORMAL LOW (ref 16–77)

## 2021-07-22 ENCOUNTER — Other Ambulatory Visit: Payer: Self-pay | Admitting: Family Medicine

## 2021-08-05 ENCOUNTER — Other Ambulatory Visit (INDEPENDENT_AMBULATORY_CARE_PROVIDER_SITE_OTHER): Payer: Medicare Other

## 2021-08-05 ENCOUNTER — Other Ambulatory Visit: Payer: Self-pay

## 2021-08-05 LAB — COMPREHENSIVE METABOLIC PANEL
ALT: 13 U/L (ref 0–53)
AST: 13 U/L (ref 0–37)
Albumin: 4.6 g/dL (ref 3.5–5.2)
Alkaline Phosphatase: 66 U/L (ref 39–117)
BUN: 21 mg/dL (ref 6–23)
CO2: 26 mEq/L (ref 19–32)
Calcium: 11.1 mg/dL — ABNORMAL HIGH (ref 8.4–10.5)
Chloride: 104 mEq/L (ref 96–112)
Creatinine, Ser: 1.85 mg/dL — ABNORMAL HIGH (ref 0.40–1.50)
GFR: 44.78 mL/min — ABNORMAL LOW (ref >60.00)
Glucose, Bld: 97 mg/dL (ref 70–99)
Potassium: 4.6 mEq/L (ref 3.5–5.1)
Sodium: 137 mEq/L (ref 135–145)
Total Bilirubin: 0.3 mg/dL (ref 0.2–1.2)
Total Protein: 7.2 g/dL (ref 6.0–8.3)

## 2021-08-05 LAB — PHOSPHORUS: Phosphorus: 3.4 mg/dL (ref 2.3–4.6)

## 2021-08-06 LAB — PARATHYROID HORMONE, INTACT (NO CA): PTH: 6 pg/mL — ABNORMAL LOW (ref 16–77)

## 2021-08-07 ENCOUNTER — Other Ambulatory Visit (INDEPENDENT_AMBULATORY_CARE_PROVIDER_SITE_OTHER): Payer: Medicare Other

## 2021-08-07 ENCOUNTER — Other Ambulatory Visit: Payer: Self-pay | Admitting: Family Medicine

## 2021-08-07 ENCOUNTER — Other Ambulatory Visit: Payer: Self-pay | Admitting: *Deleted

## 2021-08-07 DIAGNOSIS — N179 Acute kidney failure, unspecified: Secondary | ICD-10-CM

## 2021-08-07 DIAGNOSIS — R739 Hyperglycemia, unspecified: Secondary | ICD-10-CM

## 2021-08-07 LAB — VITAMIN D 25 HYDROXY (VIT D DEFICIENCY, FRACTURES): VITD: 22.48 ng/mL — ABNORMAL LOW (ref 30.00–100.00)

## 2021-08-18 ENCOUNTER — Ambulatory Visit: Payer: Medicare Other | Admitting: Physician Assistant

## 2021-08-19 ENCOUNTER — Telehealth (INDEPENDENT_AMBULATORY_CARE_PROVIDER_SITE_OTHER): Payer: Medicare Other | Admitting: Physician Assistant

## 2021-08-19 ENCOUNTER — Encounter: Payer: Self-pay | Admitting: Physician Assistant

## 2021-08-19 DIAGNOSIS — F411 Generalized anxiety disorder: Secondary | ICD-10-CM | POA: Diagnosis not present

## 2021-08-19 DIAGNOSIS — F99 Mental disorder, not otherwise specified: Secondary | ICD-10-CM | POA: Diagnosis not present

## 2021-08-19 DIAGNOSIS — E559 Vitamin D deficiency, unspecified: Secondary | ICD-10-CM | POA: Diagnosis not present

## 2021-08-19 DIAGNOSIS — F259 Schizoaffective disorder, unspecified: Secondary | ICD-10-CM | POA: Diagnosis not present

## 2021-08-19 DIAGNOSIS — F5105 Insomnia due to other mental disorder: Secondary | ICD-10-CM | POA: Diagnosis not present

## 2021-08-19 DIAGNOSIS — F17200 Nicotine dependence, unspecified, uncomplicated: Secondary | ICD-10-CM

## 2021-08-19 MED ORDER — ALPRAZOLAM 1 MG PO TABS: 1.0000 mg | ORAL_TABLET | Freq: Three times a day (TID) | ORAL | 2 refills | Status: DC | PRN

## 2021-08-19 MED ORDER — RISPERIDONE 4 MG PO TABS: 4.0000 mg | ORAL_TABLET | Freq: Every day | ORAL | 2 refills | Status: DC

## 2021-08-19 MED ORDER — GABAPENTIN 600 MG PO TABS: 1200.0000 mg | ORAL_TABLET | Freq: Three times a day (TID) | ORAL | 2 refills | Status: DC

## 2021-08-19 MED ORDER — PAROXETINE HCL 10 MG PO TABS: 10.0000 mg | ORAL_TABLET | Freq: Every day | ORAL | 2 refills | Status: DC

## 2021-08-19 MED ORDER — PAROXETINE HCL 20 MG PO TABS: 20.0000 mg | ORAL_TABLET | Freq: Every day | ORAL | 2 refills | Status: DC

## 2021-08-19 MED ORDER — RISPERIDONE 1 MG PO TABS: 1.0000 mg | ORAL_TABLET | Freq: Every day | ORAL | 2 refills | Status: DC

## 2021-08-19 MED ORDER — QUETIAPINE FUMARATE 400 MG PO TABS: ORAL_TABLET | ORAL | 2 refills | Status: DC

## 2021-08-19 NOTE — Progress Notes (Signed)
[facility]  Patient ID: CASPER PAGLIUCA,  MRN: [id number]  PCP: Sharlene Dory, DO  Date of Evaluation: 08/19/2021 Time spent:30 minutes  Chief Complaint:  Chief Complaint   Depression; Anxiety; Follow-up     Virtual Visit via Telehealth  I connected with patient by telephone, with their informed consent, and verified patient privacy and that I am speaking with the correct person using two identifiers.  I am private, in my office and the patient is at home.  I discussed the limitations, risks, security and privacy concerns of performing an evaluation and management service by telephone and the availability of in person appointments. I also discussed with the patient that there may be a patient responsible charge related to this service. The patient expressed understanding and agreed to proceed.  We were unable to connect to caregility after multiple tries.  Phone visit was necessary.   I discussed the assessment and treatment plan with the patient. The patient was provided an opportunity to ask questions and all were answered. The patient agreed with the plan and demonstrated an understanding of the instructions.   The patient was advised to call back or seek an in-person evaluation if the symptoms worsen or if the condition fails to improve as anticipated.  I provided 30 minutes of non-face-to-face time during this encounter.   HISTORY/CURRENT STATUS: HPI For 3 month med check. Mom, [name] is on the phone with him.  He woke up feeling sick with a fever and flulike symptoms.  Tested positive for COVID so needed telehealth.  As far as his mental health goes he is doing well.  At the last visit we increased Paxil and it has been beneficial.  Does have days where he feels "down" but not as often, does not last as long, and not as severe.  He is able to enjoy things.  Energy and motivation are good.  Appetite is normal.  Not isolating.  Does not cry easily.  He  sleeps well most of the time.  Anxiety is better controlled.  Not usually having panic attacks but more of a generalized sense of unease.  The Xanax is helpful.  Denies suicidal or homicidal thoughts.  Patient denies increased energy with decreased need for sleep, no increased talkativeness, no racing thoughts, no impulsivity or risky behaviors, no increased spending, no grandiosity, no increased irritability or anger, no paranoia, and no hallucinations.  Denies dizziness, syncope, seizures, numbness, tingling, tremor, tics, unsteady gait, slurred speech, confusion. Denies muscle or joint pain, stiffness, or dystonia.  Individual Medical History/ Review of Systems: Changes? :Yes    is sick today, tested positive for COVID. Has been found to have abnormal BUN and creatinine, GFR, calcium.  He is being sent to a nephrologist.  Vitamin D was also low.  His doctor recommended OTC vitamin D.  Past medications for mental health diagnoses include: Pristiq, Celexa, lithium, Depakote, carbamazepine, Ativan, Ambien, gabapentin, Ritalin, Vyvanse, Zoloft, Wellbutrin, Latuda, Seroquel, Zyprexa, Risperdal, Lamictal, Vistaril, BuSpar, clozapine, Klonopin, Paxil  Allergies: Patient has no known allergies.  Current Medications:  Current Outpatient Medications:    albuterol (VENTOLIN HFA) 108 (90 Base) MCG/ACT inhaler, Inhale 1 puff into the lungs every 6 (six) hours as needed for wheezing or shortness of breath., Disp: 18 g, Rfl: 0   Multiple Vitamins-Minerals (CENTRUM VITAMINTS PO), Take by mouth daily., Disp: , Rfl:    ALPRAZolam (XANAX) 1 MG tablet, Take 1 tablet (1 mg total) by mouth 3 (three) times daily as needed.  for anxiety, Disp: 90 tablet, Rfl: 2   gabapentin (NEURONTIN) 600 MG tablet, Take 2 tablets (1,200 mg total) by mouth 3 (three) times daily., Disp: 180 tablet, Rfl: 2   PARoxetine (PAXIL) 10 MG tablet, Take 1 tablet (10 mg total) by mouth daily. With the 20 mg tablet=30 mg., Disp: 30 tablet, Rfl:  2   PARoxetine (PAXIL) 20 MG tablet, Take 1 tablet (20 mg total) by mouth daily., Disp: 30 tablet, Rfl: 2   QUEtiapine (SEROQUEL) 400 MG tablet, TAKE 2 AND 1/2 TABLETS BY MOUTH EVERY NIGHT AT BEDTIME, Disp: 75 tablet, Rfl: 2   risperiDONE (RISPERDAL) 1 MG tablet, Take 1 tablet (1 mg total) by mouth at bedtime. With 4mg =5mg ., Disp: 30 tablet, Rfl: 2   risperidone (RISPERDAL) 4 MG tablet, Take 1 tablet (4 mg total) by mouth at bedtime. With 1 mg= 5mg ., Disp: 30 tablet, Rfl: 2 Medication Side Effects: none  Family Medical/ Social History: Changes? No  MENTAL HEALTH EXAM:  There were no vitals taken for this visit.There is no height or weight on file to calculate BMI.  General Appearance:  Unable to assess  Eye Contact:   Unable to assess  Speech:  Clear and Coherent and Normal Rate  Volume:  Normal  Mood:  Euthymic  Affect:   Unable to assess  Thought Process:  Goal Directed and Descriptions of Associations: Circumstantial  Orientation:  Full (Time, Place, and Person)  Thought Content: Logical   Suicidal Thoughts:  No  Homicidal Thoughts:  No  Memory:  WNL  Judgement:  Good  Insight:  Good  Psychomotor Activity:   Unable to assess  Concentration:  Concentration: Good  Recall:  Good  Fund of Knowledge: Good  Language: Good  Assets:  Desire for Improvement  ADL's:  Intact  Cognition: WNL  Prognosis:  Good   Labs 06/24/2021  total cholesterol 206, triglycerides 93, HDL 64, LDL 124 Glucose 94 LFTs normal Elevated creatinine, calcium, and decreased GFR noted. 07/16/2021 Glucose 81, creatinine back to high normal range 08/05/2021  glucose 97, creatinine elevated again 08/07/2021 Vitamin D 22.48  DIAGNOSES:    ICD-10-CM   1. Smoker  F17.200     2. Generalized anxiety disorder  F41.1 ALPRAZolam (XANAX) 1 MG tablet    PARoxetine (PAXIL) 20 MG tablet    QUEtiapine (SEROQUEL) 400 MG tablet    gabapentin (NEURONTIN) 600 MG tablet    3. Schizoaffective disorder, unspecified type  (HCC)  F25.9 QUEtiapine (SEROQUEL) 400 MG tablet    4. Insomnia due to other mental disorder  F51.05    F99     5. Hypovitaminosis D  E55.9         Receiving Psychotherapy: No    RECOMMENDATIONS:  PDMP was reviewed. Last Xanax filled on 07/27/2021.   I provided 30 minutes of non-face-to-face time during this encounter, including time spent before and after the visit in records review, medical decision making, counseling pertinent to today's visit, and charting.  He is doing well on mental health medications so no changes made there.  Lipid panel and glucoses are normal. As far as the decreased vitamin D level I explained that in mental health we want the number to be at least 50 but preferably 60-70.  I recommend adding vitamin D 50,000 units once weekly, but he needs to see the nephrologist first before I will add that.  And I will also discuss with his PCP before ordering that.  Ostin's mom will ask the nephrologist specifically about  the vitamin D and if it is okay to add the weekly dose and let me know what they say or at least alert me so I can look at the records in the chart. Smoking cessation discussed.  He is not ready to quit. Continue Risperdal 5 mg qhs. ( 4 mg +1 mg pills.) Continue Seroquel 400 mg, 2.5 pills nightly. Continue Paxil 30 mg daily. Continue gabapentin 600 mg, 2 p.o. every morning, q. afternoon, and nightly. Continue Xanax 1 mg, 1 p.o. 3 times daily as needed anxiety. Return in 3 months.  Melony Overly, PA-C

## 2021-08-20 DIAGNOSIS — F17200 Nicotine dependence, unspecified, uncomplicated: Secondary | ICD-10-CM | POA: Insufficient documentation

## 2021-08-20 DIAGNOSIS — F5105 Insomnia due to other mental disorder: Secondary | ICD-10-CM | POA: Insufficient documentation

## 2021-08-20 DIAGNOSIS — E559 Vitamin D deficiency, unspecified: Secondary | ICD-10-CM | POA: Insufficient documentation

## 2021-09-15 DIAGNOSIS — K219 Gastro-esophageal reflux disease without esophagitis: Secondary | ICD-10-CM | POA: Diagnosis not present

## 2021-09-15 DIAGNOSIS — F17200 Nicotine dependence, unspecified, uncomplicated: Secondary | ICD-10-CM | POA: Diagnosis not present

## 2021-09-15 DIAGNOSIS — N390 Urinary tract infection, site not specified: Secondary | ICD-10-CM | POA: Diagnosis not present

## 2021-09-15 DIAGNOSIS — N1832 Chronic kidney disease, stage 3b: Secondary | ICD-10-CM | POA: Diagnosis not present

## 2021-09-15 DIAGNOSIS — N179 Acute kidney failure, unspecified: Secondary | ICD-10-CM | POA: Diagnosis not present

## 2021-09-15 DIAGNOSIS — N189 Chronic kidney disease, unspecified: Secondary | ICD-10-CM | POA: Diagnosis not present

## 2021-09-15 DIAGNOSIS — D631 Anemia in chronic kidney disease: Secondary | ICD-10-CM | POA: Diagnosis not present

## 2021-09-16 ENCOUNTER — Other Ambulatory Visit: Payer: Self-pay | Admitting: Nephrology

## 2021-09-16 DIAGNOSIS — N179 Acute kidney failure, unspecified: Secondary | ICD-10-CM

## 2021-09-22 ENCOUNTER — Other Ambulatory Visit: Payer: Self-pay

## 2021-09-22 ENCOUNTER — Ambulatory Visit (HOSPITAL_BASED_OUTPATIENT_CLINIC_OR_DEPARTMENT_OTHER)
Admission: RE | Admit: 2021-09-22 | Discharge: 2021-09-22 | Disposition: A | Discharge status: Home / Self Care | Payer: Medicare Other | Source: Ambulatory Visit | Attending: Nephrology | Admitting: Nephrology

## 2021-09-22 DIAGNOSIS — N179 Acute kidney failure, unspecified: Secondary | ICD-10-CM | POA: Diagnosis not present

## 2021-10-04 DIAGNOSIS — Z20828 Contact with and (suspected) exposure to other viral communicable diseases: Secondary | ICD-10-CM | POA: Diagnosis not present

## 2021-10-14 ENCOUNTER — Telehealth: Payer: Self-pay | Admitting: Physician Assistant

## 2021-10-14 NOTE — Telephone Encounter (Signed)
Reginald Dunn at [employer] [facility] needs the Prior Auth for this pt sent to them.    Fax:[phone]  Reference #[id number]  Next appt 1/3

## 2021-10-15 ENCOUNTER — Telehealth: Payer: Self-pay

## 2021-10-15 NOTE — Telephone Encounter (Signed)
Prior Approval received for QUETIAPINE 400 MG #75/30 DAY effective through 11/15/2022 with [employer]

## 2021-11-18 ENCOUNTER — Other Ambulatory Visit: Payer: Self-pay

## 2021-11-18 ENCOUNTER — Ambulatory Visit (INDEPENDENT_AMBULATORY_CARE_PROVIDER_SITE_OTHER): Payer: Medicare Other | Admitting: Physician Assistant

## 2021-11-18 ENCOUNTER — Encounter: Payer: Self-pay | Admitting: Physician Assistant

## 2021-11-18 DIAGNOSIS — F17200 Nicotine dependence, unspecified, uncomplicated: Secondary | ICD-10-CM | POA: Diagnosis not present

## 2021-11-18 DIAGNOSIS — F259 Schizoaffective disorder, unspecified: Secondary | ICD-10-CM | POA: Diagnosis not present

## 2021-11-18 DIAGNOSIS — F4010 Social phobia, unspecified: Secondary | ICD-10-CM | POA: Diagnosis not present

## 2021-11-18 DIAGNOSIS — F411 Generalized anxiety disorder: Secondary | ICD-10-CM | POA: Diagnosis not present

## 2021-11-18 MED ORDER — PAROXETINE HCL 10 MG PO TABS: 10.0000 mg | ORAL_TABLET | Freq: Every day | ORAL | 3 refills | Status: DC

## 2021-11-18 MED ORDER — PAROXETINE HCL 20 MG PO TABS: 20.0000 mg | ORAL_TABLET | Freq: Every day | ORAL | 3 refills | Status: DC

## 2021-11-18 MED ORDER — RISPERIDONE 4 MG PO TABS: 4.0000 mg | ORAL_TABLET | Freq: Every day | ORAL | 3 refills | Status: DC

## 2021-11-18 MED ORDER — GABAPENTIN 600 MG PO TABS: 1200.0000 mg | ORAL_TABLET | Freq: Three times a day (TID) | ORAL | 3 refills | Status: DC

## 2021-11-18 MED ORDER — QUETIAPINE FUMARATE 400 MG PO TABS: ORAL_TABLET | ORAL | 3 refills | Status: DC

## 2021-11-18 MED ORDER — ALPRAZOLAM 1 MG PO TABS: 1.0000 mg | ORAL_TABLET | Freq: Three times a day (TID) | ORAL | 3 refills | Status: DC | PRN

## 2021-11-18 MED ORDER — RISPERIDONE 1 MG PO TABS: 1.0000 mg | ORAL_TABLET | Freq: Every day | ORAL | 3 refills | Status: DC

## 2021-11-18 NOTE — Progress Notes (Signed)
[facility]  Patient ID: Reginald Dunn,  MRN: [id number]  PCP: Sharlene Dory, DO  Date of Evaluation: 11/18/2021 Time spent:30 minutes  Chief Complaint:  Chief Complaint   Anxiety; Depression; Follow-up      HISTORY/CURRENT STATUS: HPI  For routine med check. Mom, [name] is with him.   Doing well.  His mood is stable.  Able to enjoy things.  Energy and motivation are good.  He does isolate but has been some better.  He was even able to go out of his room and socialize with the family when relatives were here over Christmas.  It was a short visit which is something he can tolerate.  Anxiety is well treated with the Xanax.  He does need it 3 times a day.  If he does not take it he feels jittery inside and like a panic attack may be coming on.  He is still unable to work because of his mental health.  Sleeps well most of the time.  ADLs are normal.  Personal hygiene normal. No suicidal or homicidal thoughts.  Continues to smoke about 1 pack/day.  He shrugs his shoulders when asked if he wants to quit or not.  He has tried Chantix in the past and it did not help at all.  Patient denies increased energy with decreased need for sleep, no increased talkativeness, no racing thoughts, no impulsivity or risky behaviors, no increased spending, no grandiosity, no increased irritability or anger, no paranoia, and no hallucinations.  Review of Systems  Constitutional: Negative.   HENT: Negative.    Eyes: Negative.   Respiratory: Negative.    Cardiovascular: Negative.   Gastrointestinal:  Positive for heartburn.       Controlled on Pepcid.  Genitourinary: Negative.   Musculoskeletal: Negative.   Skin: Negative.   Neurological: Negative.   Endo/Heme/Allergies: Negative.   Psychiatric/Behavioral:         See HPI.    Individual Medical History/ Review of Systems: Changes? :Yes    he saw the nephrologist since our last visit and got a good report.  He was taking too  many Tums and that was increasing the calcium level.  He was told to take Pepcid instead.  He was also put on vitamin D OTC.  Has had more recent labs and calcium and vitamin D were within normal range.  Was released from nephrology.  Past medications for mental health diagnoses include: Pristiq, Celexa, lithium, Depakote, carbamazepine, Ativan, Ambien, gabapentin, Ritalin, Vyvanse, Zoloft, Wellbutrin, Latuda, Seroquel, Zyprexa, Risperdal, Lamictal, Vistaril, BuSpar, clozapine, Klonopin, Paxil  Allergies: Patient has no known allergies.  Current Medications:  Current Outpatient Medications:    albuterol (VENTOLIN HFA) 108 (90 Base) MCG/ACT inhaler, Inhale 1 puff into the lungs every 6 (six) hours as needed for wheezing or shortness of breath., Disp: 18 g, Rfl: 0   cholecalciferol (VITAMIN D3) 25 MCG (1000 UNIT) tablet, Take 1,000 Units by mouth daily., Disp: , Rfl:    famotidine (PEPCID) 20 MG tablet, Take 20 mg by mouth 2 (two) times daily., Disp: , Rfl:    Multiple Vitamins-Minerals (CENTRUM VITAMINTS PO), Take by mouth daily., Disp: , Rfl:    ALPRAZolam (XANAX) 1 MG tablet, Take 1 tablet (1 mg total) by mouth 3 (three) times daily as needed. for anxiety, Disp: 90 tablet, Rfl: 3   gabapentin (NEURONTIN) 600 MG tablet, Take 2 tablets (1,200 mg total) by mouth 3 (three) times daily., Disp: 180 tablet, Rfl: 3   PARoxetine (PAXIL)  10 MG tablet, Take 1 tablet (10 mg total) by mouth daily. With the 20 mg tablet=30 mg., Disp: 30 tablet, Rfl: 3   PARoxetine (PAXIL) 20 MG tablet, Take 1 tablet (20 mg total) by mouth daily., Disp: 30 tablet, Rfl: 3   QUEtiapine (SEROQUEL) 400 MG tablet, TAKE 2 AND 1/2 TABLETS BY MOUTH EVERY NIGHT AT BEDTIME, Disp: 75 tablet, Rfl: 3   risperiDONE (RISPERDAL) 1 MG tablet, Take 1 tablet (1 mg total) by mouth at bedtime. With 4mg =5mg ., Disp: 30 tablet, Rfl: 3   risperidone (RISPERDAL) 4 MG tablet, Take 1 tablet (4 mg total) by mouth at bedtime. With 1 mg= 5mg ., Disp: 30  tablet, Rfl: 3 Medication Side Effects: none  Family Medical/ Social History: Changes? No  MENTAL HEALTH EXAM:  There were no vitals taken for this visit.There is no height or weight on file to calculate BMI.  General Appearance: Casual and Well Groomed  Eye Contact:  Good  Speech:  Clear and Coherent and Normal Rate  Volume:  Normal  Mood:  Euthymic  Affect:  Congruent  Thought Process:  Goal Directed and Descriptions of Associations: Circumstantial  Orientation:  Full (Time, Place, and Person)  Thought Content: Logical   Suicidal Thoughts:  No  Homicidal Thoughts:  No  Memory:  WNL  Judgement:  Good  Insight:  Good  Psychomotor Activity:  Normal  Concentration:  Concentration: Good  Recall:  Good  Fund of Knowledge: Good  Language: Good  Assets:  Desire for Improvement  ADL's:  Intact  Cognition: WNL  Prognosis:  Good   Labs 06/24/2021  total cholesterol 206, triglycerides 93, HDL 64, LDL 124 Glucose 94 LFTs normal Elevated creatinine, calcium, and decreased GFR noted.  07/16/2021 Glucose 81, creatinine back to high normal range  08/05/2021  glucose 97, creatinine elevated again  08/07/2021 Vitamin D 22.48  No more recent labs than these available to me.  Patient's mom states he did have a vitamin D level drawn at the nephrology office since our last visit and it was normal.  DIAGNOSES:    ICD-10-CM   1. Schizoaffective disorder, unspecified type (HCC)  F25.9 QUEtiapine (SEROQUEL) 400 MG tablet    2. Social anxiety disorder  F40.10     3. Smoker  F17.200     4. Generalized anxiety disorder  F41.1 ALPRAZolam (XANAX) 1 MG tablet    gabapentin (NEURONTIN) 600 MG tablet    PARoxetine (PAXIL) 20 MG tablet    QUEtiapine (SEROQUEL) 400 MG tablet      Receiving Psychotherapy: No    RECOMMENDATIONS:  PDMP was reviewed.  Last Xanax filled 10/26/2021. I provided 30 minutes of face to face time during this encounter, including time spent before and after the  visit in records review, medical decision making, counseling pertinent to today's visit, and charting.  He is doing well from my standpoint so no changes will be made. Smoking cessation discussed.  States he does not want to do anything medication wise to help him quit. Continue Risperdal 5 mg qhs. ( 4 mg +1 mg pills.) Continue Seroquel 400 mg, 2.5 pills nightly. Continue Paxil 30 mg daily. Continue gabapentin 600 mg, 2 p.o. every morning, q. afternoon, and nightly. Continue Xanax 1 mg, 1 p.o. 3 times daily as needed anxiety. Return in 4 months.  08/09/2021, PA-C

## 2021-11-23 ENCOUNTER — Other Ambulatory Visit: Payer: Self-pay | Admitting: Physician Assistant

## 2021-11-23 DIAGNOSIS — F411 Generalized anxiety disorder: Secondary | ICD-10-CM

## 2021-11-23 DIAGNOSIS — F259 Schizoaffective disorder, unspecified: Secondary | ICD-10-CM

## 2022-01-12 DIAGNOSIS — Z20822 Contact with and (suspected) exposure to covid-19: Secondary | ICD-10-CM | POA: Diagnosis not present

## 2022-02-19 DIAGNOSIS — Z20822 Contact with and (suspected) exposure to covid-19: Secondary | ICD-10-CM | POA: Diagnosis not present

## 2022-02-20 DIAGNOSIS — Z20822 Contact with and (suspected) exposure to covid-19: Secondary | ICD-10-CM | POA: Diagnosis not present

## 2022-02-26 DIAGNOSIS — Z20822 Contact with and (suspected) exposure to covid-19: Secondary | ICD-10-CM | POA: Diagnosis not present

## 2022-03-02 DIAGNOSIS — Z20822 Contact with and (suspected) exposure to covid-19: Secondary | ICD-10-CM | POA: Diagnosis not present

## 2022-03-19 ENCOUNTER — Encounter: Payer: Self-pay | Admitting: Physician Assistant

## 2022-03-19 ENCOUNTER — Ambulatory Visit (INDEPENDENT_AMBULATORY_CARE_PROVIDER_SITE_OTHER): Payer: Medicare Other | Admitting: Physician Assistant

## 2022-03-19 DIAGNOSIS — F3341 Major depressive disorder, recurrent, in partial remission: Secondary | ICD-10-CM | POA: Diagnosis not present

## 2022-03-19 DIAGNOSIS — F17200 Nicotine dependence, unspecified, uncomplicated: Secondary | ICD-10-CM | POA: Diagnosis not present

## 2022-03-19 DIAGNOSIS — E559 Vitamin D deficiency, unspecified: Secondary | ICD-10-CM

## 2022-03-19 DIAGNOSIS — F5105 Insomnia due to other mental disorder: Secondary | ICD-10-CM | POA: Diagnosis not present

## 2022-03-19 DIAGNOSIS — F4010 Social phobia, unspecified: Secondary | ICD-10-CM

## 2022-03-19 DIAGNOSIS — F259 Schizoaffective disorder, unspecified: Secondary | ICD-10-CM | POA: Diagnosis not present

## 2022-03-19 DIAGNOSIS — F411 Generalized anxiety disorder: Secondary | ICD-10-CM

## 2022-03-19 DIAGNOSIS — F99 Mental disorder, not otherwise specified: Secondary | ICD-10-CM | POA: Diagnosis not present

## 2022-03-19 MED ORDER — QUETIAPINE FUMARATE 400 MG PO TABS: ORAL_TABLET | ORAL | 3 refills | Status: DC

## 2022-03-19 MED ORDER — RISPERIDONE 1 MG PO TABS: 1.0000 mg | ORAL_TABLET | Freq: Every day | ORAL | 3 refills | Status: DC

## 2022-03-19 MED ORDER — PAROXETINE HCL 20 MG PO TABS: 20.0000 mg | ORAL_TABLET | Freq: Every day | ORAL | 3 refills | Status: DC

## 2022-03-19 MED ORDER — RISPERIDONE 4 MG PO TABS: 4.0000 mg | ORAL_TABLET | Freq: Every day | ORAL | 3 refills | Status: DC

## 2022-03-19 MED ORDER — PAROXETINE HCL 10 MG PO TABS: 10.0000 mg | ORAL_TABLET | Freq: Every day | ORAL | 3 refills | Status: DC

## 2022-03-19 MED ORDER — GABAPENTIN 600 MG PO TABS: 1200.0000 mg | ORAL_TABLET | Freq: Three times a day (TID) | ORAL | 3 refills | Status: DC

## 2022-03-19 MED ORDER — ALPRAZOLAM 1 MG PO TABS: 1.0000 mg | ORAL_TABLET | Freq: Three times a day (TID) | ORAL | 3 refills | Status: DC | PRN

## 2022-03-19 NOTE — Progress Notes (Signed)
[facility] ? ?Patient ID: Reginald Dunn,  ?MRN: [id number] ? ?PCP: Reginald Dory, DO ? ?Date of Evaluation: 03/19/2022 ?Time spent:30 minutes ? ?Chief Complaint:  ?Chief Complaint   ?Anxiety; Depression; Follow-up ?  ? ? ?HISTORY/CURRENT STATUS: ?HPI  For routine med check. Mom, Reginald Dunn is with him.  ? ?[name] states he is doing well for the most part.  He might have 1 or 2 days of depressive symptoms per month.  He will feel "down" and in a "funk" but it passes within a day or so.  Most of the time though he is able to enjoy things.  Energy and motivation are good.  He is unable to work due to his mental health.  He does not go out very often at all, does not like to be in crowds.  He is excited to go out tomorrow for [employer], will go with his dad before it gets really crowded, and will be outside at [employer].  He does not like to be inside in crowds.  Appetite is normal and weight is stable.  ADLs and personal hygiene are normal.  No suicidal or homicidal thoughts. ? ?Still has anxiety, if it wasn't for the xanax, he would have much more anxiety and not be able to function as well as he does.  At least now he goes out of his room and socializes with his family not having panic attacks but more and overwhelmed sensation that occurs multiple times a day.  Xanax is still effective. ? ?Patient denies increased energy with decreased need for sleep, no increased talkativeness, no racing thoughts, no impulsivity or risky behaviors, no increased spending, no grandiosity, no increased irritability or anger, no paranoia, and no hallucinations. ? ?Review of Systems  ?Constitutional: Negative.   ?HENT:    ?     Has a scratchy throat today but no other symptoms.  ?Eyes: Negative.   ?Respiratory: Negative.    ?Cardiovascular: Negative.   ?Gastrointestinal: Negative.   ?Genitourinary: Negative.   ?Musculoskeletal: Negative.   ?Skin: Negative.   ?Neurological: Negative.   ?Endo/Heme/Allergies:  Negative.   ?Psychiatric/Behavioral:    ?     See HPI.  ? ? ?Individual Medical History/ Review of Systems: Changes? :No     ? ?Past medications for mental health diagnoses include: ?Pristiq, Celexa, lithium, Depakote, carbamazepine, Ativan, Ambien, gabapentin, Ritalin, Vyvanse, Zoloft, Wellbutrin, Latuda, Seroquel, Zyprexa, Risperdal, Lamictal, Vistaril, BuSpar, clozapine, Klonopin, Paxil ? ?Allergies: Patient has no known allergies. ? ?Current Medications:  ?Current Outpatient Medications:  ?  albuterol (VENTOLIN HFA) 108 (90 Base) MCG/ACT inhaler, Inhale 1 puff into the lungs every 6 (six) hours as needed for wheezing or shortness of breath., Disp: 18 g, Rfl: 0 ?  cholecalciferol (VITAMIN D3) 25 MCG (1000 UNIT) tablet, Take 1,000 Units by mouth daily., Disp: , Rfl:  ?  famotidine (PEPCID) 20 MG tablet, Take 20 mg by mouth 2 (two) times daily., Disp: , Rfl:  ?  Multiple Vitamins-Minerals (CENTRUM VITAMINTS PO), Take by mouth daily., Disp: , Rfl:  ?  ALPRAZolam (XANAX) 1 MG tablet, Take 1 tablet (1 mg total) by mouth 3 (three) times daily as needed. for anxiety, Disp: 90 tablet, Rfl: 3 ?  gabapentin (NEURONTIN) 600 MG tablet, Take 2 tablets (1,200 mg total) by mouth 3 (three) times daily., Disp: 180 tablet, Rfl: 3 ?  PARoxetine (PAXIL) 10 MG tablet, Take 1 tablet (10 mg total) by mouth daily. With the 20 mg tablet=30 mg., Disp:  30 tablet, Rfl: 3 ?  PARoxetine (PAXIL) 20 MG tablet, Take 1 tablet (20 mg total) by mouth daily., Disp: 30 tablet, Rfl: 3 ?  QUEtiapine (SEROQUEL) 400 MG tablet, TAKE 2 AND 1/2 TABLETS BY MOUTH EVERY NIGHT AT BEDTIME, Disp: 75 tablet, Rfl: 3 ?  risperiDONE (RISPERDAL) 1 MG tablet, Take 1 tablet (1 mg total) by mouth at bedtime. With 4mg =5mg ., Disp: 30 tablet, Rfl: 3 ?  risperidone (RISPERDAL) 4 MG tablet, Take 1 tablet (4 mg total) by mouth at bedtime. With 1 mg= 5mg ., Disp: 30 tablet, Rfl: 3 ?Medication Side Effects: none ? ?Family Medical/ Social History: Changes? No ? ?MENTAL HEALTH  EXAM: ? ?There were no vitals taken for this visit.There is no height or weight on file to calculate BMI.  ?General Appearance: Casual and Well Groomed  ?Eye Contact:  Good  ?Speech:  Clear and Coherent and Normal Rate  ?Volume:  Normal  ?Mood:  Euthymic  ?Affect:  Congruent  ?Thought Process:  Goal Directed and Descriptions of Associations: Circumstantial  ?Orientation:  Full (Time, Place, and Person)  ?Thought Content: Logical   ?Suicidal Thoughts:  No  ?Homicidal Thoughts:  No  ?Memory:  WNL  ?Judgement:  Good  ?Insight:  Good  ?Psychomotor Activity:  Normal  ?Concentration:  Concentration: Good and Attention Span: Good  ?Recall:  Good  ?Fund of Knowledge: Good  ?Language: Good  ?Assets:  Desire for Improvement  ?ADL's:  Intact  ?Cognition: WNL  ?Prognosis:  Good  ? ?Labs ?06/24/2021  ?total cholesterol 206, triglycerides 93, HDL 64, LDL 124 ?Glucose 94 ?LFTs normal ?Elevated creatinine, calcium, and decreased GFR noted. ? ?07/16/2021 ?Glucose 81, creatinine back to high normal range ? ?08/05/2021  ?glucose 97, creatinine elevated again ? ?08/07/2021 ?Vitamin D 22.48 ? ?No more recent labs than these available to me.   ? ?DIAGNOSES:  ?  ICD-10-CM   ?1. Social anxiety disorder  F40.10   ?  ?2. Generalized anxiety disorder  F41.1 ALPRAZolam (XANAX) 1 MG tablet  ?  gabapentin (NEURONTIN) 600 MG tablet  ?  PARoxetine (PAXIL) 20 MG tablet  ?  QUEtiapine (SEROQUEL) 400 MG tablet  ?  ?3. Schizoaffective disorder, unspecified type (HCC)  F25.9 QUEtiapine (SEROQUEL) 400 MG tablet  ?  ?4. Insomnia due to other mental disorder  F51.05   ? F99   ?  ?5. Hypovitaminosis D  E55.9   ?  ?6. Depression, major, recurrent, in partial remission (HCC)  F33.41   ?  ?7. Smoker  F17.200   ?  ? ? ?Receiving Psychotherapy: No  ? ? ?RECOMMENDATIONS:  ?PDMP was reviewed.  Last Xanax filled 02/20/2022. ?I provided 30 minutes of face to face time during this encounter, including time spent before and after the visit in records review, medical  decision making, counseling pertinent to today's visit, and charting.  ?We discussed the symptoms of depression that he has, they only last for a day or so per month and he is able to push through it until he feels better.  Shonte, his mom, and I feel like no changes should be made at this time.  Of course if the symptoms worsen and the depression lasted for more than a few days a month or are accompanied by suicidal thoughts, they should let me know.  Call [phone] [facility] if he becomes suicidal. ?Again discussed smoking cessation.  He is not ready to quit and does not want any help at this  time. ? ?Continue Xanax 1 mg, 1 p.o. 3 times daily as needed anxiety. ?Continue gabapentin 600 mg, 2 p.o. every morning, q. afternoon, and nightly.  ?Continue Paxil 30 mg daily. ?Continue Seroquel 400 mg, 2.5 pills nightly. ?Continue Risperdal 5 mg qhs. ( 4 mg +1 mg pills.) ?Return in 4 months. ? ?Melony Overly, PA-C  ?

## 2022-03-23 DIAGNOSIS — Z20822 Contact with and (suspected) exposure to covid-19: Secondary | ICD-10-CM | POA: Diagnosis not present

## 2022-06-30 ENCOUNTER — Ambulatory Visit (INDEPENDENT_AMBULATORY_CARE_PROVIDER_SITE_OTHER): Payer: Medicare Other | Admitting: Family Medicine

## 2022-06-30 ENCOUNTER — Encounter: Payer: Self-pay | Admitting: Family Medicine

## 2022-06-30 ENCOUNTER — Other Ambulatory Visit: Payer: Self-pay | Admitting: Family Medicine

## 2022-06-30 VITALS — BP 108/70 | HR 73 | Temp 97.3°F | Ht 71.0 in | Wt 175.0 lb

## 2022-06-30 DIAGNOSIS — E785 Hyperlipidemia, unspecified: Secondary | ICD-10-CM

## 2022-06-30 DIAGNOSIS — Z0000 Encounter for general adult medical examination without abnormal findings: Secondary | ICD-10-CM | POA: Diagnosis not present

## 2022-06-30 DIAGNOSIS — E559 Vitamin D deficiency, unspecified: Secondary | ICD-10-CM | POA: Diagnosis not present

## 2022-06-30 DIAGNOSIS — R7989 Other specified abnormal findings of blood chemistry: Secondary | ICD-10-CM

## 2022-06-30 LAB — LIPID PANEL
Cholesterol: 190 mg/dL (ref 0–200)
HDL: 62.5 mg/dL (ref >39.00)
LDL Cholesterol: 97 mg/dL (ref 0–99)
NonHDL: 127.07
Total CHOL/HDL Ratio: 3
Triglycerides: 149 mg/dL (ref 0.0–149.0)
VLDL: 29.8 mg/dL (ref 0.0–40.0)

## 2022-06-30 LAB — COMPREHENSIVE METABOLIC PANEL
ALT: 13 U/L (ref 0–53)
AST: 14 U/L (ref 0–37)
Albumin: 4.3 g/dL (ref 3.5–5.2)
Alkaline Phosphatase: 66 U/L (ref 39–117)
BUN: 25 mg/dL — ABNORMAL HIGH (ref 6–23)
CO2: 29 mEq/L (ref 19–32)
Calcium: 11.4 mg/dL — ABNORMAL HIGH (ref 8.4–10.5)
Chloride: 100 mEq/L (ref 96–112)
Creatinine, Ser: 2.86 mg/dL — ABNORMAL HIGH (ref 0.40–1.50)
GFR: 26.38 mL/min — ABNORMAL LOW (ref >60.00)
Glucose, Bld: 86 mg/dL (ref 70–99)
Potassium: 4.2 mEq/L (ref 3.5–5.1)
Sodium: 139 mEq/L (ref 135–145)
Total Bilirubin: 0.3 mg/dL (ref 0.2–1.2)
Total Protein: 6.4 g/dL (ref 6.0–8.3)

## 2022-06-30 LAB — VITAMIN D 25 HYDROXY (VIT D DEFICIENCY, FRACTURES): VITD: 24.26 ng/mL — ABNORMAL LOW (ref 30.00–100.00)

## 2022-06-30 MED ORDER — ONDANSETRON HCL 4 MG PO TABS: 4.0000 mg | ORAL_TABLET | Freq: Three times a day (TID) | ORAL | 0 refills | Status: DC | PRN

## 2022-06-30 MED ORDER — ALBUTEROL SULFATE HFA 108 (90 BASE) MCG/ACT IN AERS: 1.0000 | INHALATION_SPRAY | Freq: Four times a day (QID) | RESPIRATORY_TRACT | 0 refills | Status: DC | PRN

## 2022-06-30 NOTE — Patient Instructions (Addendum)
Give [place] 2-3 business days to get the results of your labs back.   Keep the diet clean and stay active.  Please get me a copy of your advanced directive form at your convenience.   I recommend getting the flu shot in mid October. This suggestion would change if the CDC comes out with a different recommendation.   Let me know if you would like to pursue something to help you stop smoking.   Let us know if you need anything.  Mid-Back Strain Rehab It is normal to feel mild stretching, pulling, tightness, or discomfort as you do these exercises, but you should stop right away if you feel sudden pain or your pain gets worse.  Stretching and range of motion exercises This exercise warms up your muscles and joints and improves the movement and flexibility of your back and shoulders. This exercise also help to relieve pain. Exercise A: Chest and spine stretch  Lie down on your back on a firm surface. Roll a towel or a small blanket so it is about 4 inches (10 cm) in diameter. Put the towel lengthwise under the middle of your back so it is under your spine, but not under your shoulder blades. To increase the stretch, you may put your hands behind your head and let your elbows fall to your sides. Hold for 30 seconds. Repeat exercise 2 times. Complete this exercise 3 times per week. Strengthening exercises These exercises build strength and endurance in your back and your shoulder blade muscles. Endurance is the ability to use your muscles for a long time, even after they get tired. Exercise C: Straight arm rows (shoulder extension) Stand with your feet shoulder width apart. Secure an exercise band to a stable object in front of you so the band is at or above shoulder height. Hold one end of the exercise band in each hand. Straighten your elbows and lift your hands up to shoulder height. Step back, away from the secured end of the exercise band, until the band stretches. Squeeze your shoulder  blades together and pull your hands down to the sides of your thighs. Stop when your hands are straight down by your sides. Do not let your hands go behind your body. Hold for 2 seconds. Slowly return to the starting position. Repeat 2 times. Complete this exercise 3 times per week. Exercise D: Shoulder external rotation, prone Lie on your abdomen on a firm bed so your left / right forearm hangs over the edge of the bed and your upper arm is on the bed, straight out from your body. Your elbow should be bent. Your palm should be facing your feet. If instructed, hold a 2-5 lb weight in your hand. Squeeze your shoulder blade toward the middle of your back. Do not let your shoulder lift toward your ear. Keep your elbow bent in an "L" shape (90 degrees) while you slowly move your forearm up toward the ceiling. Move your forearm up to the height of the bed, toward your head. Your upper arm should not move. At the top of the movement, your palm should face the floor. Hold for 1 second. Slowly return to the starting position and relax your muscles. Repeat 2 times. Complete this exercise 3 times per week. Exercise E: Scapular retraction and external rotation, rowing  Sit in a stable chair without armrests, or stand. Secure an exercise band to a stable object in front of you so it is at shoulder height. Hold one end  of the exercise band in each hand. Bring your arms out straight in front of you. Step back, away from the secured end of the exercise band, until the band stretches. Pull the band backward. As you do this, bend your elbows and squeeze your shoulder blades together, but avoid letting the rest of your body move. Do not let your shoulders lift up toward your ears. Stop when your elbows are at your sides or slightly behind your body. Hold for 1 second1. Slowly straighten your arms to return to the starting position. Repeat 2 times. Complete this exercise 3 times per week. Posture and body  mechanics  Body mechanics refers to the movements and positions of your body while you do your daily activities. Posture is part of body mechanics. Good posture and healthy body mechanics can help to relieve stress in your body's tissues and joints. Good posture means that your spine is in its natural S-curve position (your spine is neutral), your shoulders are pulled back slightly, and your head is not tipped forward. The following are general guidelines for applying improved posture and body mechanics to your everyday activities. Standing  When standing, keep your spine neutral and your feet about hip-width apart. Keep a slight bend in your knees. Your ears, shoulders, and hips should line up. When you do a task in which you lean forward while standing in one place for a long time, place one foot up on a stable object that is 2-4 inches (5-10 cm) high, such as a footstool. This helps keep your spine neutral. Sitting  When sitting, keep your spine neutral and keep your feet flat on the floor. Use a footrest, if necessary, and keep your thighs parallel to the floor. Avoid rounding your shoulders, and avoid tilting your head forward. When working at a desk or a computer, keep your desk at a height where your hands are slightly lower than your elbows. Slide your chair under your desk so you are close enough to maintain good posture. When working at a computer, place your monitor at a height where you are looking straight ahead and you do not have to tilt your head forward or downward to look at the screen. Resting  When lying down and resting, avoid positions that are most painful for you. If you have pain with activities such as sitting, bending, stooping, or squatting (flexion-based activities), lie in a position in which your body does not bend very much. For example, avoid curling up on your side with your arms and knees near your chest (fetal position). If you have pain with activities such as  standing for a long time or reaching with your arms (extension-based activities), lie with your spine in a neutral position and bend your knees slightly. Try the following positions: Lying on your side with a pillow between your knees. Lying on your back with a pillow under your knees.  Lifting  When lifting objects, keep your feet at least shoulder-width apart and tighten your abdominal muscles. Bend your knees and hips and keep your spine neutral. It is important to lift using the strength of your legs, not your back. Do not lock your knees straight out. Always ask for help to lift heavy or awkward objects. Make sure you discuss any questions you have with your health care provider. Document Released: 11/02/2005 Document Revised: 07/09/2016 Document Reviewed: 08/14/2015 Elsevier Interactive Patient Education  [employer].

## 2022-06-30 NOTE — Progress Notes (Signed)
Subjective:   Reginald Dunn is a 41 y.o. male who presents for Medicare Annual/Subsequent preventive examination.  Review of Systems    10 pt ROS neg       Objective:    Today's Vitals   06/30/22 0801  BP: 108/70  Pulse: 73  Temp: (!) 97.3 F (36.3 C)  TempSrc: Oral  SpO2: 97%  Weight: 175 lb (79.4 kg)  Height: 5\' 11"  (1.803 m)   Body mass index is 24.41 kg/m.     12/13/2018   10:22 AM  Advanced Directives  Does Patient Have a Medical Advance Directive? No  Would patient like information on creating a medical advance directive? No - Patient declined    Current Medications (verified) Outpatient Encounter Medications as of 06/30/2022  Medication Sig   albuterol (VENTOLIN HFA) 108 (90 Base) MCG/ACT inhaler Inhale 1 puff into the lungs every 6 (six) hours as needed for wheezing or shortness of breath.   ALPRAZolam (XANAX) 1 MG tablet Take 1 tablet (1 mg total) by mouth 3 (three) times daily as needed. for anxiety   cholecalciferol (VITAMIN D3) 25 MCG (1000 UNIT) tablet Take 1,000 Units by mouth daily.   famotidine (PEPCID) 20 MG tablet Take 20 mg by mouth 2 (two) times daily.   gabapentin (NEURONTIN) 600 MG tablet Take 2 tablets (1,200 mg total) by mouth 3 (three) times daily.   Multiple Vitamins-Minerals (CENTRUM VITAMINTS PO) Take by mouth daily.   PARoxetine (PAXIL) 10 MG tablet Take 1 tablet (10 mg total) by mouth daily. With the 20 mg tablet=30 mg.   PARoxetine (PAXIL) 20 MG tablet Take 1 tablet (20 mg total) by mouth daily.   QUEtiapine (SEROQUEL) 400 MG tablet TAKE 2 AND 1/2 TABLETS BY MOUTH EVERY NIGHT AT BEDTIME   risperiDONE (RISPERDAL) 1 MG tablet Take 1 tablet (1 mg total) by mouth at bedtime. With 4mg =5mg .   risperidone (RISPERDAL) 4 MG tablet Take 1 tablet (4 mg total) by mouth at bedtime. With 1 mg= 5mg .    Allergies (verified) Patient has no known allergies.   History: Past Medical History:  Diagnosis Date   ADHD    Allergy    seasonal   Blood  transfusion without reported diagnosis    2017   Depression    GERD (gastroesophageal reflux disease)    Social anxiety disorder    Past Surgical History:  Procedure Laterality Date   HIATAL HERNIA REPAIR  08/2016   Family History  Problem Relation Age of Onset   Colon cancer Neg Hx    Colon polyps Neg Hx    Esophageal cancer Neg Hx    Pancreatic cancer Neg Hx    Prostate cancer Neg Hx    Rectal cancer Neg Hx    Social History   Socioeconomic History   Marital status: Single                        Tobacco Use   Smoking status: Every Day    Packs/day: 1.00    Types: Cigarettes   Smokeless tobacco: Never  Vaping Use   Vaping Use: Never used  Substance and Sexual Activity   Alcohol use: Not Currently    Alcohol/week: 1.0 standard drink of alcohol    Types: 1 Cans of beer per week    Comment: socially q3 months   Drug use: Never   Sexual activity: Not on file  Other Topics Concern   Not on file  Social  History Narrative   Not on file   Social Determinants of Health   Financial Resource Strain: Not on file  Food Insecurity: Not on file  Transportation Needs: Not on file  Physical Activity: Not on file  Stress: Not on file  Social Connections: Not on file    Tobacco Counseling Ready to quit: No Counseling given: Yes    Diabetic? No  Interpreter Needed?: No  Patient Care Team: Sharlene Dory, DO as PCP - General (Family Medicine) Cherie Ouch, [place]- Psychiatry  Indicate any recent Medical Services you may have received from other than [facility] providers in the past year (date may be approximate).     Assessment:   This is a routine wellness examination for Reginald Dunn.  Hearing/Vision screen Hearing Screening   500Hz  1000Hz  2000Hz  4000Hz   Right ear Pass Pass Pass Pass  Left ear Pass Pass Pass Pass   Vision Screening   Right eye Left eye Both eyes  Without correction 20/30 20/10 20/10   With correction       Dietary issues and exercise  activities discussed:     Goals Addressed   None    Depression Screen    06/30/2022    8:00 AM 06/24/2021    9:15 AM  PHQ 2/9 Scores  PHQ - 2 Score 0 0  PHQ- 9 Score 2 0    Fall Risk    06/30/2022    8:00 AM 06/24/2021    9:15 AM  Fall Risk   Falls in the past year? 0 0  Number falls in past yr: 0 0  Injury with Fall? 0 0  Risk for fall due to : No Fall Risks No Fall Risks  Follow up Falls evaluation completed Falls evaluation completed    FALL RISK PREVENTION PERTAINING TO THE HOME:  Any stairs in or around the home? Yes  If so, are there any without handrails? No  Home free of loose throw rugs in walkways, pet beds, electrical cords, etc? Yes  Adequate lighting in your home to reduce risk of falls? Yes   ASSISTIVE DEVICES UTILIZED TO PREVENT FALLS:  Life alert? No  Use of a cane, walker or w/c? No  Grab bars in the bathroom? No  Shower chair or bench in shower? No  Elevated toilet seat or a handicapped toilet? No   TIMED UP AND GO:  Was the test performed? Yes .  Length of time to ambulate 10 feet: 5 sec.   Gait steady and fast without use of assistive device  Cognitive Function: Alert and oriented to person, place, time  Immunizations Immunization History  Administered Date(s) Administered   PFIZER(Purple Top)SARS-COV-2 Vaccination 02/29/2020, 03/25/2020, 10/06/2020    TDAP status: Up to date  Will plan to get flu shot this fall  Pneumococcal vaccine status: not due at this time  Covid-19 vaccine status: Completed initial vaccines  Qualifies for Shingles Vaccine? No, he is not yet 49 Zostavax completed No    Screening Tests Health Maintenance  Topic Date Due   INFLUENZA VACCINE  06/16/2022   COVID-19 Vaccine (4 - Pfizer risk series) 12/31/2022 (Originally 12/01/2020)   Hepatitis C Screening  Completed   HIV Screening  Completed   HPV VACCINES  Aged Out   TETANUS/TDAP  Discontinued    Health Maintenance  Health Maintenance Due  Topic  Date Due   INFLUENZA VACCINE  06/16/2022    Colorectal cancer screening: not due yet  Lung Cancer Screening: (Low Dose CT Chest recommended  if Age 37-80 years, 30 pack-year currently smoking OR have quit w/in 15years.) does not qualify.   Lung Cancer Screening Referral: N/A  Additional Screening:  Hepatitis C Screening: does qualify; Completed: 06/24/21  Vision Screening: Recommended annual ophthalmology exams for early detection of glaucoma and other disorders of the eye. Is the patient up to date with their annual eye exam?  No  Who is the provider or what is the name of the office in which the patient attends annual eye exams? N/A If pt is not established with a provider, would they like to be referred to a provider to establish care? No .   Dental Screening: Recommended annual dental exams for proper oral hygiene  Community Resource Referral / Chronic Care Management: CRR required this visit?  No   CCM required this visit?  No      Plan:     I have personally reviewed and noted the following in the patient's chart:   Medical and social history Use of alcohol, tobacco or illicit drugs  Current medications and supplements including opioid prescriptions. Patient is not currently taking opioid prescriptions. Functional ability and status Nutritional status Physical activity Advanced directives List of other physicians Hospitalizations, surgeries, and ER visits in previous 12 months Vitals Screenings to include cognitive, depression, and falls Referrals and appointments  In addition, I have reviewed and discussed with patient certain preventive protocols, quality metrics, and best practice recommendations. A written personalized care plan for preventive services as well as general preventive health recommendations were provided to patient.     Jilda Roche [place], DO   06/30/2022

## 2022-07-03 NOTE — Addendum Note (Signed)
Addended by: Mervin Kung A on: 07/03/2022 10:04 AM   Modules accepted: Orders

## 2022-07-10 ENCOUNTER — Other Ambulatory Visit: Payer: Self-pay | Admitting: Family Medicine

## 2022-07-10 ENCOUNTER — Other Ambulatory Visit (INDEPENDENT_AMBULATORY_CARE_PROVIDER_SITE_OTHER): Payer: Medicare Other

## 2022-07-10 DIAGNOSIS — R7989 Other specified abnormal findings of blood chemistry: Secondary | ICD-10-CM

## 2022-07-10 DIAGNOSIS — N1832 Chronic kidney disease, stage 3b: Secondary | ICD-10-CM

## 2022-07-10 LAB — URINALYSIS
Bilirubin Urine: NEGATIVE
Hgb urine dipstick: NEGATIVE
Ketones, ur: NEGATIVE
Leukocytes,Ua: NEGATIVE
Nitrite: NEGATIVE
Specific Gravity, Urine: 1.01 (ref 1.000–1.030)
Total Protein, Urine: NEGATIVE
Urine Glucose: NEGATIVE
Urobilinogen, UA: 0.2 (ref 0.0–1.0)
pH: 6.5 (ref 5.0–8.0)

## 2022-07-10 LAB — BASIC METABOLIC PANEL
BUN: 18 mg/dL (ref 6–23)
CO2: 26 mEq/L (ref 19–32)
Calcium: 10.7 mg/dL — ABNORMAL HIGH (ref 8.4–10.5)
Chloride: 101 mEq/L (ref 96–112)
Creatinine, Ser: 2.39 mg/dL — ABNORMAL HIGH (ref 0.40–1.50)
GFR: 32.72 mL/min — ABNORMAL LOW (ref >60.00)
Glucose, Bld: 95 mg/dL (ref 70–99)
Potassium: 4.3 mEq/L (ref 3.5–5.1)
Sodium: 137 mEq/L (ref 135–145)

## 2022-07-10 LAB — MICROALBUMIN / CREATININE URINE RATIO
Creatinine,U: 88.1 mg/dL
Microalb Creat Ratio: 0.8 mg/g (ref 0.0–30.0)
Microalb, Ur: <0.7 mg/dL (ref 0.0–1.9)

## 2022-07-18 ENCOUNTER — Other Ambulatory Visit: Payer: Self-pay | Admitting: Physician Assistant

## 2022-07-18 DIAGNOSIS — F411 Generalized anxiety disorder: Secondary | ICD-10-CM

## 2022-07-18 DIAGNOSIS — F259 Schizoaffective disorder, unspecified: Secondary | ICD-10-CM

## 2022-07-21 ENCOUNTER — Encounter: Payer: Self-pay | Admitting: Physician Assistant

## 2022-07-21 ENCOUNTER — Ambulatory Visit (INDEPENDENT_AMBULATORY_CARE_PROVIDER_SITE_OTHER): Payer: Medicare Other | Admitting: Physician Assistant

## 2022-07-21 DIAGNOSIS — R7989 Other specified abnormal findings of blood chemistry: Secondary | ICD-10-CM

## 2022-07-21 DIAGNOSIS — N189 Chronic kidney disease, unspecified: Secondary | ICD-10-CM | POA: Diagnosis not present

## 2022-07-21 DIAGNOSIS — F411 Generalized anxiety disorder: Secondary | ICD-10-CM

## 2022-07-21 DIAGNOSIS — E559 Vitamin D deficiency, unspecified: Secondary | ICD-10-CM

## 2022-07-21 DIAGNOSIS — F4010 Social phobia, unspecified: Secondary | ICD-10-CM | POA: Diagnosis not present

## 2022-07-21 DIAGNOSIS — F17200 Nicotine dependence, unspecified, uncomplicated: Secondary | ICD-10-CM | POA: Diagnosis not present

## 2022-07-21 DIAGNOSIS — F259 Schizoaffective disorder, unspecified: Secondary | ICD-10-CM

## 2022-07-21 DIAGNOSIS — D631 Anemia in chronic kidney disease: Secondary | ICD-10-CM | POA: Diagnosis not present

## 2022-07-21 DIAGNOSIS — N179 Acute kidney failure, unspecified: Secondary | ICD-10-CM | POA: Diagnosis not present

## 2022-07-21 DIAGNOSIS — K219 Gastro-esophageal reflux disease without esophagitis: Secondary | ICD-10-CM | POA: Diagnosis not present

## 2022-07-21 MED ORDER — RISPERIDONE 1 MG PO TABS
1.0000 mg | ORAL_TABLET | Freq: Every day | ORAL | 3 refills | Status: DC
Start: 2022-07-21 — End: 2022-11-06

## 2022-07-21 MED ORDER — GABAPENTIN 600 MG PO TABS: 1200.0000 mg | ORAL_TABLET | Freq: Three times a day (TID) | ORAL | 3 refills | Status: DC

## 2022-07-21 MED ORDER — ALPRAZOLAM 1 MG PO TABS: 1.0000 mg | ORAL_TABLET | Freq: Three times a day (TID) | ORAL | 3 refills | Status: DC | PRN

## 2022-07-21 MED ORDER — QUETIAPINE FUMARATE 400 MG PO TABS: ORAL_TABLET | ORAL | 3 refills | Status: DC

## 2022-07-21 MED ORDER — RISPERIDONE 4 MG PO TABS
4.0000 mg | ORAL_TABLET | Freq: Every day | ORAL | 3 refills | Status: DC
Start: 2022-07-21 — End: 2022-11-06

## 2022-07-21 MED ORDER — PAROXETINE HCL 20 MG PO TABS: 20.0000 mg | ORAL_TABLET | Freq: Every day | ORAL | 3 refills | Status: DC

## 2022-07-21 MED ORDER — PAROXETINE HCL 10 MG PO TABS
10.0000 mg | ORAL_TABLET | Freq: Every day | ORAL | 3 refills | Status: DC
Start: 2022-07-21 — End: 2022-11-06

## 2022-07-21 NOTE — Progress Notes (Signed)
[facility]  Patient ID: Reginald Dunn,  MRN: [id number]  PCP: Sharlene Dory, DO  Date of Evaluation: 07/21/2022 Time spent:30 minutes  Chief Complaint:  Chief Complaint   Follow-up    HISTORY/CURRENT STATUS: HPI  For routine med check. Mom, [name] is with him.   Reginald Dunn is doing well as far as his mental health is concerned.  Feels that his medications at the current doses are working.  He sleeps well most of the time.  He is unable to work due to mental health reasons. Patient denies increased energy with decreased need for sleep, increased talkativeness, racing thoughts, impulsivity or risky behaviors, increased spending, increased libido, grandiosity, increased irritability or anger, paranoia, or hallucinations.  Patient is able to enjoy things.  Energy and motivation are good.  No extreme sadness, tearfulness, or feelings of hopelessness. ADLs and personal hygiene are normal.   Denies any changes in concentration, making decisions, or remembering things.  Appetite has not changed.  Weight is stable.  Denies suicidal or homicidal thoughts.  Still has anxiety daily.  Mostly generalized, but does feel panicky if he gets overwhelmed.  Xanax is helpful.  Review of Systems  Constitutional: Negative.   HENT: Negative.    Eyes: Negative.   Respiratory: Negative.    Cardiovascular: Negative.   Gastrointestinal:  Positive for nausea and vomiting.       No known reason.  His primary care provider is aware and has prescribed Zofran.  He has not taken it yet though.  Genitourinary: Negative.   Musculoskeletal: Negative.   Skin: Negative.   Neurological: Negative.   Endo/Heme/Allergies: Negative.   Psychiatric/Behavioral:         See HPI.    Individual Medical History/ Review of Systems: Changes? :Yes     abnormal BUN and creatinine, elevated calcium, he is seeing a nephrologist later today. Vitamin D is still very low.  His PCP recently increased his daily  vitamin D from 1000 IUs to 2000 IUs.  Labs reviewed on chart.   Past medications for mental health diagnoses include: Pristiq, Celexa, lithium, Depakote, carbamazepine, Ativan, Ambien, gabapentin, Ritalin, Vyvanse, Zoloft, Wellbutrin, Latuda, Seroquel, Zyprexa, Risperdal, Lamictal, Vistaril, BuSpar, clozapine, Klonopin, Paxil  Allergies: Patient has no known allergies.  Current Medications:  Current Outpatient Medications:    albuterol (VENTOLIN HFA) 108 (90 Base) MCG/ACT inhaler, Inhale 1 puff into the lungs every 6 (six) hours as needed for wheezing or shortness of breath., Disp: 18 g, Rfl: 0   cholecalciferol (VITAMIN D3) 25 MCG (1000 UNIT) tablet, Take 2,000 Units by mouth daily., Disp: , Rfl:    famotidine (PEPCID) 20 MG tablet, Take 20 mg by mouth 2 (two) times daily., Disp: , Rfl:    Multiple Vitamins-Minerals (CENTRUM VITAMINTS PO), Take by mouth daily., Disp: , Rfl:    ondansetron (ZOFRAN) 4 MG tablet, Take 1 tablet (4 mg total) by mouth every 8 (eight) hours as needed for nausea or vomiting., Disp: 30 tablet, Rfl: 0   ALPRAZolam (XANAX) 1 MG tablet, Take 1 tablet (1 mg total) by mouth 3 (three) times daily as needed. for anxiety, Disp: 90 tablet, Rfl: 3   gabapentin (NEURONTIN) 600 MG tablet, Take 2 tablets (1,200 mg total) by mouth 3 (three) times daily., Disp: 180 tablet, Rfl: 3   PARoxetine (PAXIL) 10 MG tablet, Take 1 tablet (10 mg total) by mouth daily. With the 20 mg tablet=30 mg., Disp: 30 tablet, Rfl: 3   PARoxetine (PAXIL) 20 MG tablet, Take 1  tablet (20 mg total) by mouth daily., Disp: 30 tablet, Rfl: 3   QUEtiapine (SEROQUEL) 400 MG tablet, TAKE 2 AND 1/2 TABLETS BY MOUTH EVERY NIGHT AT BEDTIME, Disp: 75 tablet, Rfl: 3   risperiDONE (RISPERDAL) 1 MG tablet, Take 1 tablet (1 mg total) by mouth at bedtime. With 4mg =5mg ., Disp: 30 tablet, Rfl: 3   risperidone (RISPERDAL) 4 MG tablet, Take 1 tablet (4 mg total) by mouth at bedtime. With 1 mg= 5mg ., Disp: 30 tablet, Rfl:  3 Medication Side Effects: none  Family Medical/ Social History: Changes? No  MENTAL HEALTH EXAM:  There were no vitals taken for this visit.There is no height or weight on file to calculate BMI.  General Appearance: Casual and Well Groomed  Eye Contact:  Good  Speech:  Clear and Coherent and Normal Rate  Volume:  Normal  Mood:  Euthymic  Affect:  Congruent  Thought Process:  Goal Directed and Descriptions of Associations: Circumstantial  Orientation:  Full (Time, Place, and Person)  Thought Content: Logical   Suicidal Thoughts:  No  Homicidal Thoughts:  No  Memory:  WNL  Judgement:  Good  Insight:  Good  Psychomotor Activity:  Normal  Concentration:  Concentration: Good and Attention Span: Good  Recall:  Good  Fund of Knowledge: Good  Language: Good  Assets:  Desire for Improvement Financial Resources/Insurance Housing Transportation  ADL's:  Intact  Cognition: WNL  Prognosis:  Good   Labs 06/30/2022 CMP BUN 25, creatinine 2.8, GFR 26.38, calcium 11.4, glucose 86, otherwise normal Lipid panel total cholesterol 190, triglycerides 149, HDL 62.5, LDL 97 Vitamin D 24.26  07/10/2022 BMP creatinine 2.3, BUN 18, calcium 10.7, GFR 32.72  DIAGNOSES:    ICD-10-CM   1. Schizoaffective disorder, unspecified type (HCC)  F25.9 QUEtiapine (SEROQUEL) 400 MG tablet    2. Generalized anxiety disorder  F41.1 ALPRAZolam (XANAX) 1 MG tablet    gabapentin (NEURONTIN) 600 MG tablet    QUEtiapine (SEROQUEL) 400 MG tablet    PARoxetine (PAXIL) 20 MG tablet    3. Hypovitaminosis D  E55.9     4. Social anxiety disorder  F40.10     5. Smoker  F17.200     6. Abnormal serum creatinine level  R79.89     7. Serum calcium elevated  E83.52      Receiving Psychotherapy: No    RECOMMENDATIONS:  PDMP was reviewed.  Last Xanax filled 06/18/2022. I provided 30 minutes of face to face time during this encounter, including time spent before and after the visit in records review, medical  decision making, counseling pertinent to today's visit, and charting.   We discussed his abnormal labs.  He has been on lithium in the past, which can affect the kidneys and increased calcium.  He has been off lithium for many years so I do not think that is the cause.  He and his mom will bring this up when they see the nephrologist later today.  Vitamin D level is very low, and psychiatry would like for it to be at least 50 and 60-70 would be even better.  I am not sure if this will be addressed with the nephrologist or not, if not 07/12/2022 will let me know and I would like to prescribe 50,000 IUs weekly and then recheck vitamin D level in 6 to 8 weeks.  They understand.  Smoking cessation discussed.  He is not ready to quit.  He is doing well as far as mental health medications go so  no changes will be made.  Continue Xanax 1 mg, 1 p.o. 3 times daily as needed anxiety. Continue gabapentin 600 mg, 2 p.o. every morning, q. afternoon, and nightly.  Continue Paxil 30 mg daily.  Menses 10 mg +20 mg) Continue Seroquel 400 mg, 2.5 pills nightly. Continue Risperdal 5 mg qhs. ( 4 mg +1 mg pills.) Continue multivitamin, vitamin 2000 IUs daily. Return in 4 months.  Melony Overly, PA-C

## 2022-07-21 NOTE — Telephone Encounter (Signed)
Has appt with Teresa today 

## 2022-07-22 ENCOUNTER — Telehealth: Payer: Self-pay | Admitting: Family Medicine

## 2022-07-22 NOTE — Telephone Encounter (Signed)
Patient's mother called to let us know that [facility] will be faxing over forms with required labs the patient needs to get drawn in two weeks. Advised patient's mom they will get a call once the labs are ordered. Please advise.

## 2022-07-24 ENCOUNTER — Other Ambulatory Visit: Payer: Self-pay | Admitting: Family Medicine

## 2022-07-24 DIAGNOSIS — N1832 Chronic kidney disease, stage 3b: Secondary | ICD-10-CM

## 2022-07-24 NOTE — Telephone Encounter (Signed)
Order received. Lab appt made/order entered.

## 2022-08-04 ENCOUNTER — Other Ambulatory Visit (INDEPENDENT_AMBULATORY_CARE_PROVIDER_SITE_OTHER): Payer: Medicare Other

## 2022-08-04 DIAGNOSIS — N1832 Chronic kidney disease, stage 3b: Secondary | ICD-10-CM | POA: Diagnosis not present

## 2022-08-04 LAB — COMPREHENSIVE METABOLIC PANEL
ALT: 18 U/L (ref 0–53)
AST: 12 U/L (ref 0–37)
Albumin: 4.2 g/dL (ref 3.5–5.2)
Alkaline Phosphatase: 77 U/L (ref 39–117)
BUN: 19 mg/dL (ref 6–23)
CO2: 23 mEq/L (ref 19–32)
Calcium: 8.9 mg/dL (ref 8.4–10.5)
Chloride: 103 mEq/L (ref 96–112)
Creatinine, Ser: 1.61 mg/dL — ABNORMAL HIGH (ref 0.40–1.50)
GFR: 52.54 mL/min — ABNORMAL LOW (ref >60.00)
Glucose, Bld: 167 mg/dL — ABNORMAL HIGH (ref 70–99)
Potassium: 4.2 mEq/L (ref 3.5–5.1)
Sodium: 134 mEq/L — ABNORMAL LOW (ref 135–145)
Total Bilirubin: 0.3 mg/dL (ref 0.2–1.2)
Total Protein: 6.6 g/dL (ref 6.0–8.3)

## 2022-08-04 LAB — RENAL FUNCTION PANEL
Albumin: 4.2 g/dL (ref 3.5–5.2)
BUN: 19 mg/dL (ref 6–23)
CO2: 23 mEq/L (ref 19–32)
Calcium: 8.9 mg/dL (ref 8.4–10.5)
Chloride: 103 mEq/L (ref 96–112)
Creatinine, Ser: 1.61 mg/dL — ABNORMAL HIGH (ref 0.40–1.50)
GFR: 52.54 mL/min — ABNORMAL LOW (ref >60.00)
Glucose, Bld: 167 mg/dL — ABNORMAL HIGH (ref 70–99)
Phosphorus: 2 mg/dL — ABNORMAL LOW (ref 2.3–4.6)
Potassium: 4.2 mEq/L (ref 3.5–5.1)
Sodium: 134 mEq/L — ABNORMAL LOW (ref 135–145)

## 2022-08-04 LAB — CBC WITH DIFFERENTIAL/PLATELET
Basophils Absolute: 0 10*3/uL (ref 0.0–0.1)
Basophils Relative: 0.5 % (ref 0.0–3.0)
Eosinophils Absolute: 0.1 10*3/uL (ref 0.0–0.7)
Eosinophils Relative: 1.2 % (ref 0.0–5.0)
HCT: 34.6 % — ABNORMAL LOW (ref 39.0–52.0)
Hemoglobin: 11.9 g/dL — ABNORMAL LOW (ref 13.0–17.0)
Lymphocytes Relative: 22 % (ref 12.0–46.0)
Lymphs Abs: 1.7 10*3/uL (ref 0.7–4.0)
MCHC: 34.3 g/dL (ref 30.0–36.0)
MCV: 98.8 fl (ref 78.0–100.0)
Monocytes Absolute: 0.6 10*3/uL (ref 0.1–1.0)
Monocytes Relative: 7.1 % (ref 3.0–12.0)
Neutro Abs: 5.4 10*3/uL (ref 1.4–7.7)
Neutrophils Relative %: 69.2 % (ref 43.0–77.0)
Platelets: 249 10*3/uL (ref 150.0–400.0)
RBC: 3.51 Mil/uL — ABNORMAL LOW (ref 4.22–5.81)
RDW: 14.1 % (ref 11.5–15.5)
WBC: 7.8 10*3/uL (ref 4.0–10.5)

## 2022-08-04 LAB — PHOSPHORUS: Phosphorus: 2 mg/dL — ABNORMAL LOW (ref 2.3–4.6)

## 2022-08-15 IMAGING — US US RENAL
1 series · 14 of 25 positions shown · non-contrast
Comparison: CT 12/13/2018

CLINICAL DATA: Acute kidney injury

EXAM:
RENAL / URINARY TRACT ULTRASOUND COMPLETE

[Series 1: us renal · 14 of 33 slices shown]
[im 1/33]
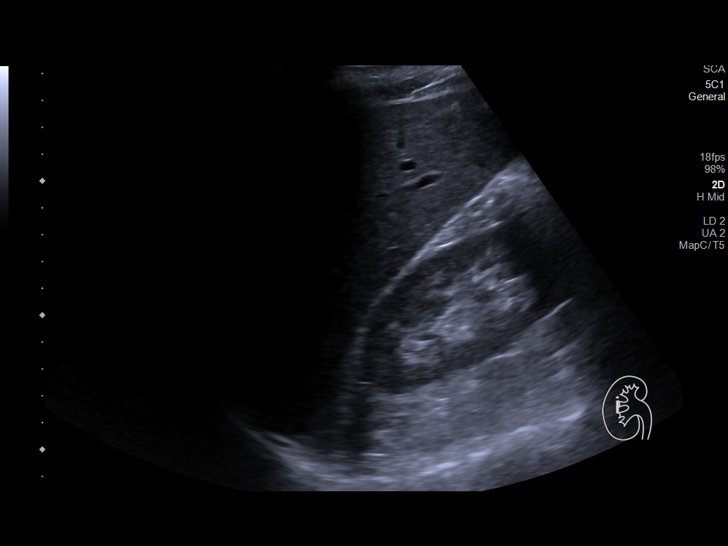
[im 3/33]
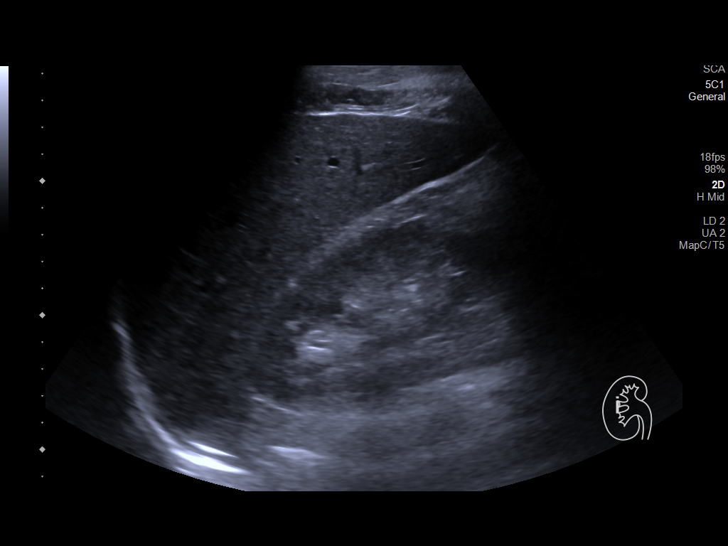
[im 6/33]
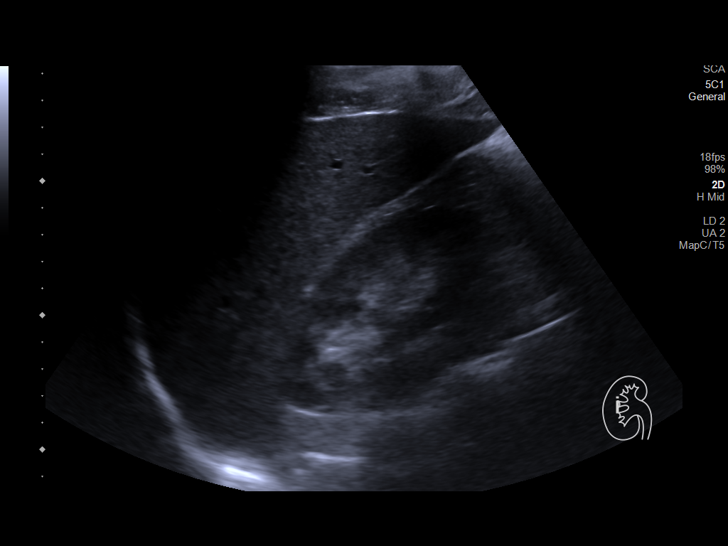
[im 9/33]
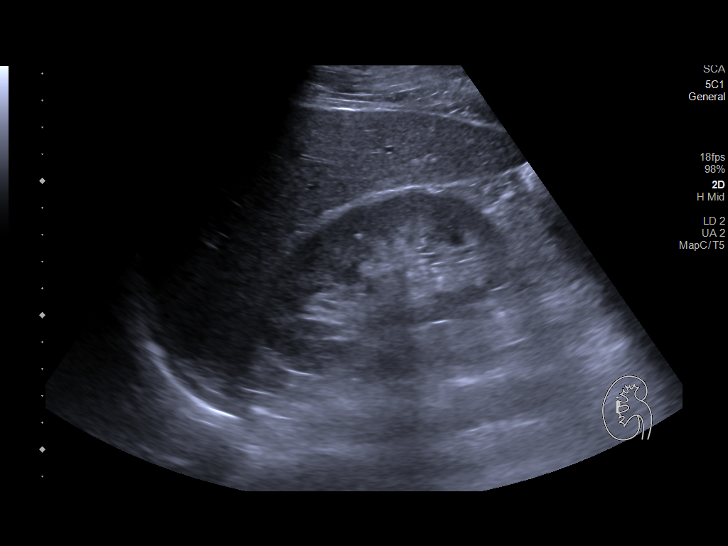
[im 11/33]
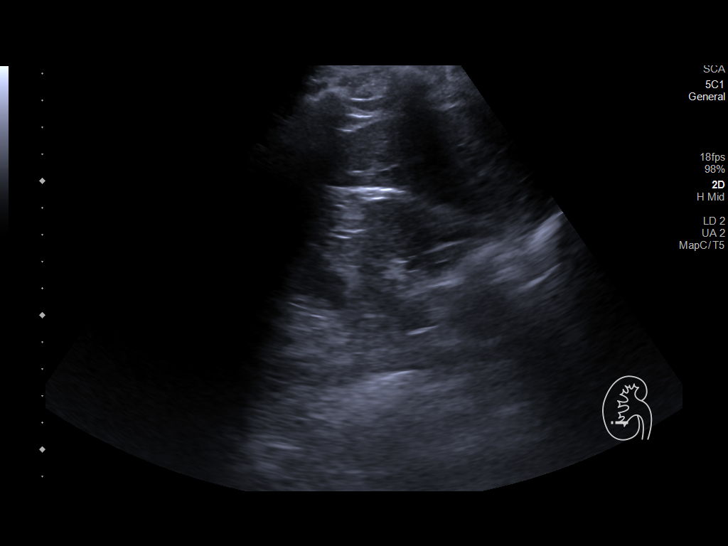
[im 13/33]
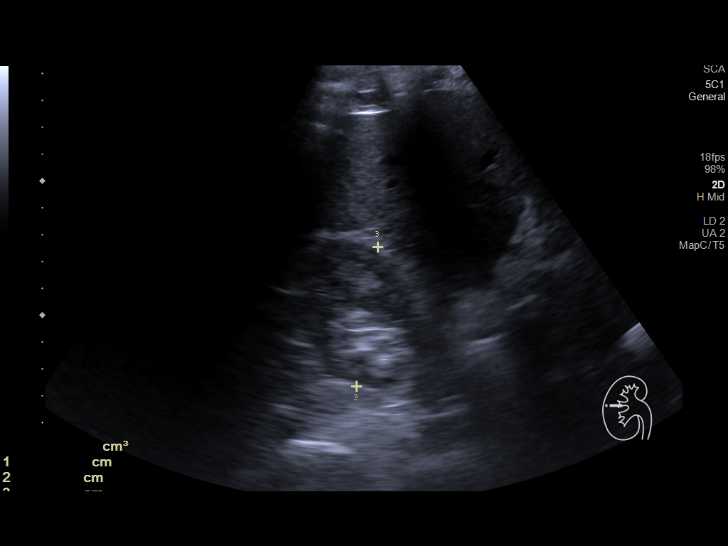
[im 15/33]
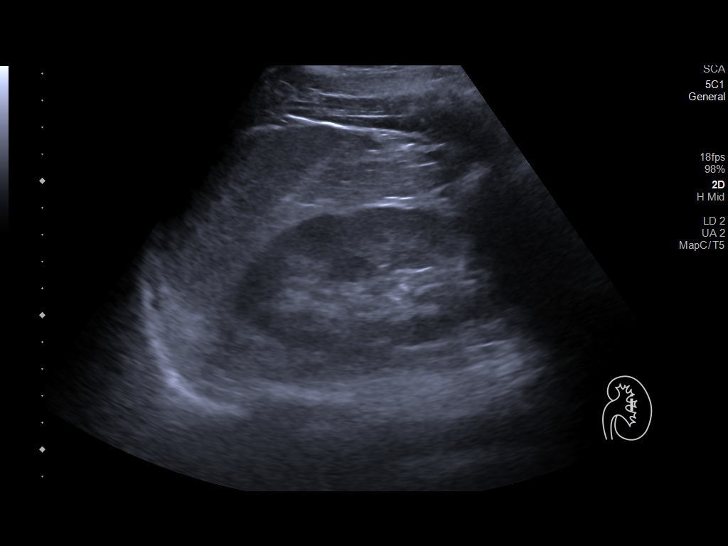
[im 18/33]
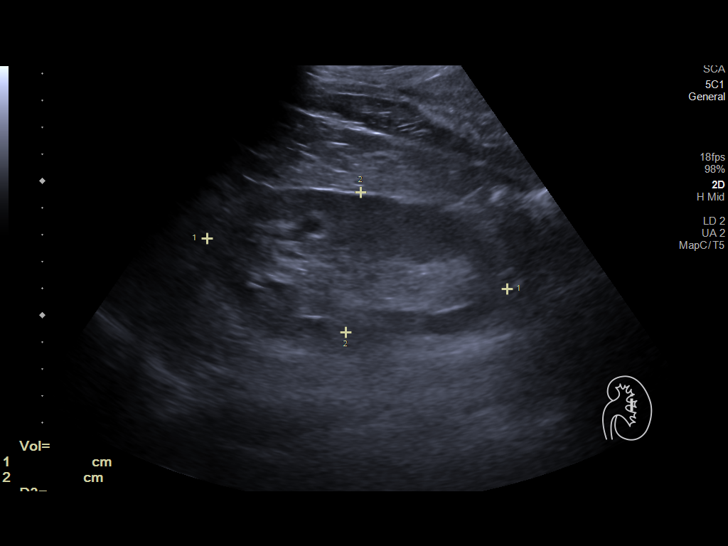
[im 21/33]
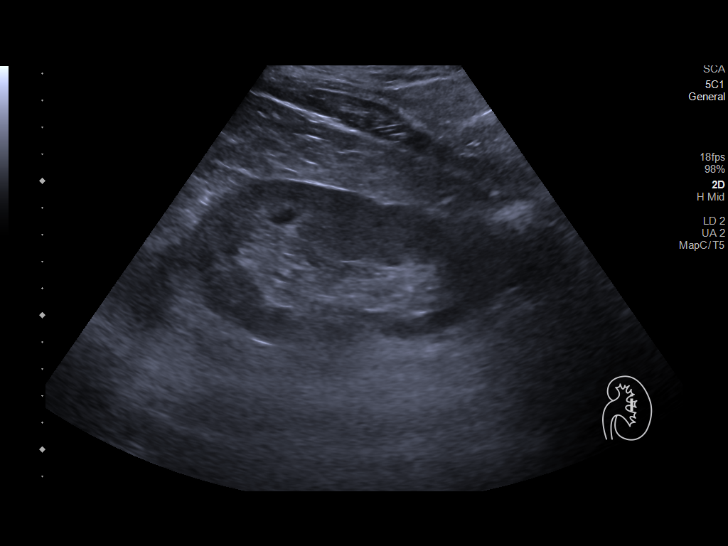
[im 22/33]
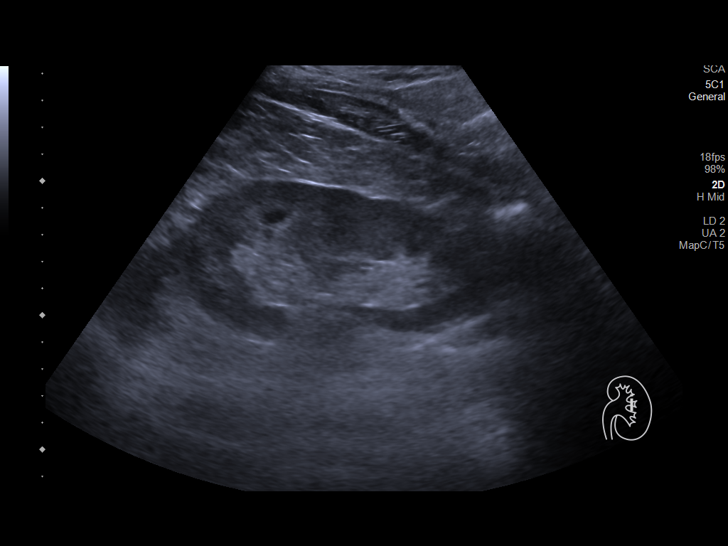
[im 25/33]
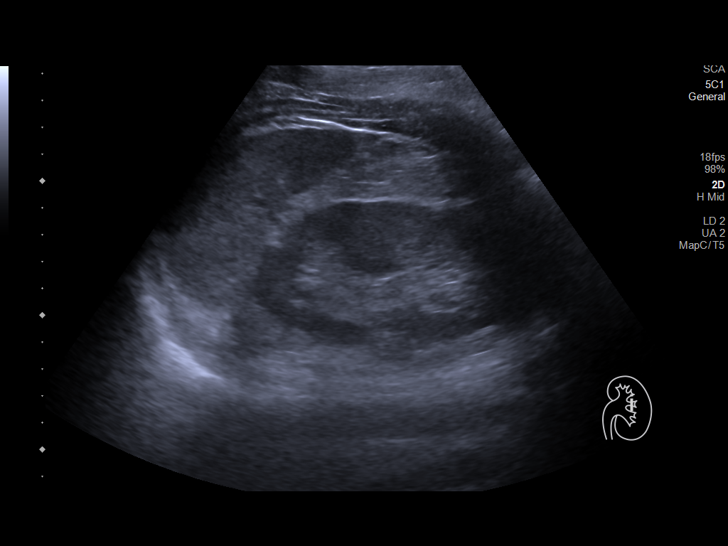
[im 27/33]
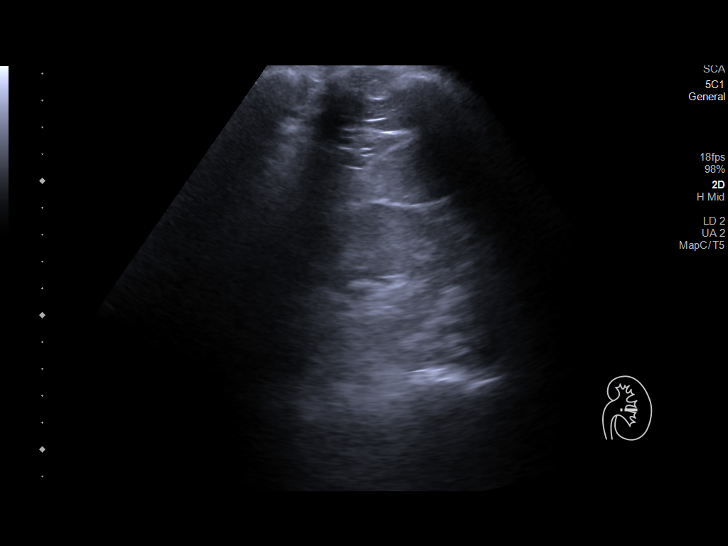
[im 30/33]
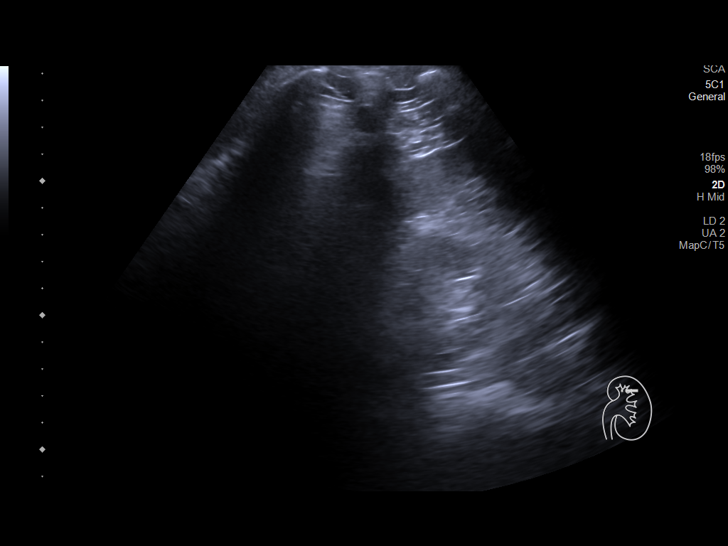
[im 33/33]
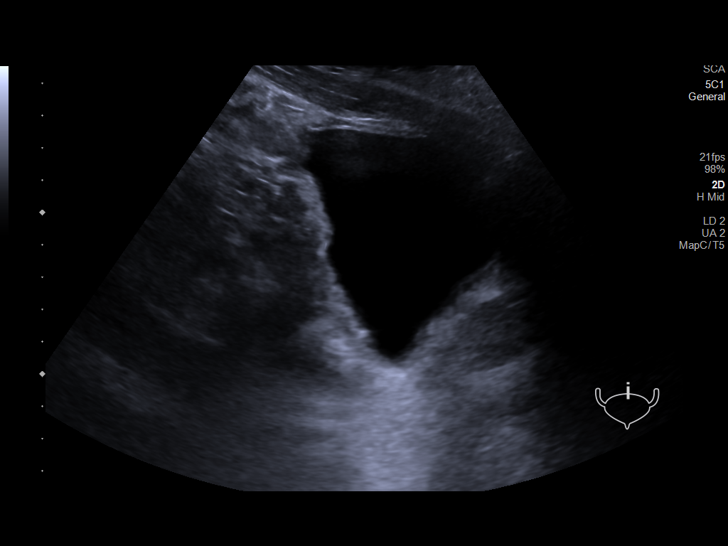

[14 of 25 positions shown; findings below may reference images not displayed]

FINDINGS: Right Kidney:

Renal measurements: 10.5 x 4.4 x 5.3 cm = volume: 126.4 mL.
Echogenicity within normal limits. No mass or hydronephrosis
visualized.

Left Kidney:

Renal measurements: 11.3 x 5.2 x 5.1 cm = volume: 159.2 mL.
Echogenicity within normal limits. No mass or hydronephrosis
visualized.

Bladder:

Appears normal for degree of bladder distention.

Other:

None.
IMPRESSION: Negative renal ultrasound

## 2022-08-19 ENCOUNTER — Telehealth: Payer: Self-pay | Admitting: Family Medicine

## 2022-08-19 NOTE — Telephone Encounter (Signed)
Arlene from [facility] stated they did not receive labs faxed on 9/25, and would like the resent. She stated we did use the correct fax number, and to put attn to Dr.Singh

## 2022-08-19 NOTE — Telephone Encounter (Signed)
Faxed results again to DR. Singh at [phone]

## 2022-08-20 DIAGNOSIS — K219 Gastro-esophageal reflux disease without esophagitis: Secondary | ICD-10-CM | POA: Diagnosis not present

## 2022-08-20 DIAGNOSIS — N179 Acute kidney failure, unspecified: Secondary | ICD-10-CM | POA: Diagnosis not present

## 2022-08-20 DIAGNOSIS — D631 Anemia in chronic kidney disease: Secondary | ICD-10-CM | POA: Diagnosis not present

## 2022-08-20 DIAGNOSIS — N189 Chronic kidney disease, unspecified: Secondary | ICD-10-CM | POA: Diagnosis not present

## 2022-10-20 ENCOUNTER — Telehealth: Payer: Self-pay | Admitting: Physician Assistant

## 2022-10-20 NOTE — Telephone Encounter (Signed)
Patient's mom Denise lvm requesting refill for all Reginald Dunn's medications. He will run out 12/30 and next appt is 1/8. Would like prescriptions sent to [facility] [address] [place]

## 2022-10-26 ENCOUNTER — Telehealth: Payer: Self-pay | Admitting: Physician Assistant

## 2022-10-26 NOTE — Telephone Encounter (Signed)
Mom lvm that hakim will need all of his medications refilled . He will run out on 11/14/22. Next appt is 11/23/22

## 2022-10-31 ENCOUNTER — Other Ambulatory Visit: Payer: Self-pay | Admitting: Physician Assistant

## 2022-10-31 DIAGNOSIS — F411 Generalized anxiety disorder: Secondary | ICD-10-CM

## 2022-10-31 DIAGNOSIS — F259 Schizoaffective disorder, unspecified: Secondary | ICD-10-CM

## 2022-11-06 ENCOUNTER — Other Ambulatory Visit: Payer: Self-pay

## 2022-11-06 ENCOUNTER — Telehealth: Payer: Self-pay | Admitting: Physician Assistant

## 2022-11-06 DIAGNOSIS — F259 Schizoaffective disorder, unspecified: Secondary | ICD-10-CM

## 2022-11-06 DIAGNOSIS — F411 Generalized anxiety disorder: Secondary | ICD-10-CM

## 2022-11-06 MED ORDER — PAROXETINE HCL 10 MG PO TABS: 10.0000 mg | ORAL_TABLET | Freq: Every day | ORAL | 3 refills | Status: DC

## 2022-11-06 MED ORDER — PAROXETINE HCL 20 MG PO TABS: 20.0000 mg | ORAL_TABLET | Freq: Every day | ORAL | 3 refills | Status: DC

## 2022-11-06 MED ORDER — GABAPENTIN 600 MG PO TABS: 1200.0000 mg | ORAL_TABLET | Freq: Three times a day (TID) | ORAL | 3 refills | Status: DC

## 2022-11-06 MED ORDER — QUETIAPINE FUMARATE 400 MG PO TABS: ORAL_TABLET | ORAL | 3 refills | Status: DC

## 2022-11-06 MED ORDER — RISPERIDONE 4 MG PO TABS: 4.0000 mg | ORAL_TABLET | Freq: Every day | ORAL | 3 refills | Status: DC

## 2022-11-06 MED ORDER — RISPERIDONE 1 MG PO TABS: 1.0000 mg | ORAL_TABLET | Freq: Every day | ORAL | 3 refills | Status: DC

## 2022-11-06 NOTE — Telephone Encounter (Signed)
It is too early for the Xanax, have pended for when due.

## 2022-11-06 NOTE — Telephone Encounter (Signed)
Pharmacy called and said that they don't have a script for the xanax. Please send one in.

## 2022-11-06 NOTE — Telephone Encounter (Signed)
Called pharmacy, last filled 12/1.

## 2022-11-12 ENCOUNTER — Other Ambulatory Visit: Payer: Self-pay

## 2022-11-12 DIAGNOSIS — F411 Generalized anxiety disorder: Secondary | ICD-10-CM

## 2022-11-12 MED ORDER — ALPRAZOLAM 1 MG PO TABS: 1.0000 mg | ORAL_TABLET | Freq: Three times a day (TID) | ORAL | 0 refills | Status: DC | PRN

## 2022-11-12 NOTE — Telephone Encounter (Signed)
Pended.

## 2022-11-13 ENCOUNTER — Other Ambulatory Visit: Payer: Self-pay

## 2022-11-13 NOTE — Telephone Encounter (Signed)
All RF have been sent.

## 2022-11-23 ENCOUNTER — Ambulatory Visit (INDEPENDENT_AMBULATORY_CARE_PROVIDER_SITE_OTHER): Payer: Medicare Other | Admitting: Physician Assistant

## 2022-11-23 ENCOUNTER — Telehealth: Payer: Self-pay

## 2022-11-23 ENCOUNTER — Encounter: Payer: Self-pay | Admitting: Physician Assistant

## 2022-11-23 DIAGNOSIS — F259 Schizoaffective disorder, unspecified: Secondary | ICD-10-CM

## 2022-11-23 DIAGNOSIS — F411 Generalized anxiety disorder: Secondary | ICD-10-CM

## 2022-11-23 DIAGNOSIS — F17200 Nicotine dependence, unspecified, uncomplicated: Secondary | ICD-10-CM | POA: Diagnosis not present

## 2022-11-23 MED ORDER — ALPRAZOLAM 1 MG PO TABS: 1.0000 mg | ORAL_TABLET | Freq: Three times a day (TID) | ORAL | 2 refills | Status: DC | PRN

## 2022-11-23 NOTE — Progress Notes (Signed)
[facility]  Patient ID: Reginald Dunn,  MRN: [id number]  PCP: Shelda Pal, DO  Date of Evaluation: 11/23/2022 Time spent:20 minutes  Chief Complaint:  Chief Complaint   Anxiety; Depression; Follow-up    HISTORY/CURRENT STATUS: HPI  For routine med check. Mom, Langley Gauss is with him.   Has occas days of depression, only wants to isolate more but no other sx of depression. Lasts only about a day, once a week or so. Most of the time, patient is able to enjoy things.  Energy and motivation are good.  Unable to work for mental health reasons.  No extreme sadness, tearfulness, or feelings of hopelessness.  Sleeps well most of the time; since time changed in the fall, he's been waking up around 4:30 am and gets up, not taking naps, no caffeine after 7:30 AM usually.  ADLs and personal hygiene are normal.   Denies any changes in concentration, making decisions, or remembering things.  Appetite has not changed.  Weight is stable.  Take Xanax routinely. If he doesn't gets very anxious and has PA. Has been on it for a long time. Denies suicidal or homicidal thoughts.  Patient denies increased energy with decreased need for sleep, increased talkativeness, racing thoughts, impulsivity or risky behaviors, increased spending, increased libido, grandiosity, increased irritability or anger, paranoia, or hallucinations.  Denies dizziness, syncope, seizures, numbness, tingling, tremor, tics, unsteady gait, slurred speech, confusion. Denies muscle or joint pain, stiffness, or dystonia.  Individual Medical History/ Review of Systems: Changes? :No      Past medications for mental health diagnoses include: Pristiq, Celexa, lithium, Depakote, carbamazepine, Ativan, Ambien, gabapentin, Ritalin, Vyvanse, Zoloft, Wellbutrin, Latuda, Seroquel, Zyprexa, Risperdal, Lamictal, Vistaril, BuSpar, clozapine, Klonopin, Paxil  Allergies: Patient has no known allergies.  Current Medications:  Current  Outpatient Medications:    albuterol (VENTOLIN HFA) 108 (90 Base) MCG/ACT inhaler, Inhale 1 puff into the lungs every 6 (six) hours as needed for wheezing or shortness of breath., Disp: 18 g, Rfl: 0   gabapentin (NEURONTIN) 600 MG tablet, Take 2 tablets (1,200 mg total) by mouth 3 (three) times daily., Disp: 180 tablet, Rfl: 3   PARoxetine (PAXIL) 10 MG tablet, Take 1 tablet (10 mg total) by mouth daily. With the 20 mg tablet=30 mg., Disp: 30 tablet, Rfl: 3   PARoxetine (PAXIL) 20 MG tablet, Take 1 tablet (20 mg total) by mouth daily., Disp: 30 tablet, Rfl: 3   QUEtiapine (SEROQUEL) 400 MG tablet, TAKE 2 AND 1/2 TABLETS BY MOUTH EVERY NIGHT AT BEDTIME, Disp: 75 tablet, Rfl: 3   risperiDONE (RISPERDAL) 1 MG tablet, Take 1 tablet (1 mg total) by mouth at bedtime. With 4mg =5mg ., Disp: 30 tablet, Rfl: 3   risperidone (RISPERDAL) 4 MG tablet, Take 1 tablet (4 mg total) by mouth at bedtime. With 1 mg= 5mg ., Disp: 30 tablet, Rfl: 3   ALPRAZolam (XANAX) 1 MG tablet, Take 1 tablet (1 mg total) by mouth 3 (three) times daily as needed. for anxiety, Disp: 90 tablet, Rfl: 2   cholecalciferol (VITAMIN D3) 25 MCG (1000 UNIT) tablet, Take 2,000 Units by mouth daily. (Patient not taking: Reported on 11/23/2022), Disp: , Rfl:    famotidine (PEPCID) 20 MG tablet, Take 20 mg by mouth 2 (two) times daily. (Patient not taking: Reported on 11/23/2022), Disp: , Rfl:    Multiple Vitamins-Minerals (CENTRUM VITAMINTS PO), Take by mouth daily. (Patient not taking: Reported on 11/23/2022), Disp: , Rfl:    ondansetron (ZOFRAN) 4 MG tablet, Take 1 tablet (  4 mg total) by mouth every 8 (eight) hours as needed for nausea or vomiting. (Patient not taking: Reported on 11/23/2022), Disp: 30 tablet, Rfl: 0 Medication Side Effects: none  Family Medical/ Social History: Changes? No  MENTAL HEALTH EXAM:  There were no vitals taken for this visit.There is no height or weight on file to calculate BMI.  General Appearance: Casual and Well Groomed   Eye Contact:  Good  Speech:  Clear and Coherent and Normal Rate  Volume:  Normal  Mood:  Euthymic  Affect:  Congruent  Thought Process:  Goal Directed and Descriptions of Associations: Circumstantial  Orientation:  Full (Time, Place, and Person)  Thought Content: Logical   Suicidal Thoughts:  No  Homicidal Thoughts:  No  Memory:  WNL  Judgement:  Good  Insight:  Good  Psychomotor Activity:  Normal  Concentration:  Concentration: Good and Attention Span: Good  Recall:  Good  Fund of Knowledge: Good  Language: Good  Assets:  Desire for Improvement Financial Resources/Insurance Housing Resilience Transportation  ADL's:  Intact  Cognition: WNL  Prognosis:  Good   PCP and Nephrology follow labs.   DIAGNOSES:    ICD-10-CM   1. Schizoaffective disorder, unspecified type (HCC)  F25.9     2. Generalized anxiety disorder  F41.1 ALPRAZolam (XANAX) 1 MG tablet    3. Smoker  F17.200      Receiving Psychotherapy: No   RECOMMENDATIONS:  PDMP was reviewed.  Last Xanax filled 11/13/2022 I provided 20 minutes of face to face time during this encounter, including time spent before and after the visit in records review, medical decision making, counseling pertinent to today's visit, and charting.   Smoking cessation discussed.  He is not ready to quit. Even though he's only getting 5 1/2-6 hours of sleep, it's not affecting him negatively. Has no sx of mania. We agree to make no changes since he's functioning normally, but will let me know that changes. As far as the sporadic depression, we agree to make no changes in meds, but he or his Mom will let me know if it worsens in any way.   Continue Xanax 1 mg, 1 p.o. 3 times daily as needed anxiety. Continue gabapentin 600 mg, 2 p.o. every morning, q. afternoon, and nightly.  Continue Paxil 30 mg daily.   (10 mg +20 mg) Continue Seroquel 400 mg, 2.5 pills nightly. Continue Risperdal 5 mg qhs. ( 4 mg +1 mg pills.) Return in 4  months.  Melony Overly, PA-C

## 2022-11-23 NOTE — Telephone Encounter (Signed)
Prior authorization submitted for Alprazolam 1 mg but response was not needed. Will contact pharmacy

## 2022-11-24 ENCOUNTER — Encounter: Payer: Self-pay | Admitting: Physician Assistant

## 2022-11-26 ENCOUNTER — Encounter: Payer: Self-pay | Admitting: Nephrology

## 2023-02-18 DIAGNOSIS — N183 Chronic kidney disease, stage 3 unspecified: Secondary | ICD-10-CM | POA: Diagnosis not present

## 2023-02-18 DIAGNOSIS — F17200 Nicotine dependence, unspecified, uncomplicated: Secondary | ICD-10-CM | POA: Diagnosis not present

## 2023-02-18 DIAGNOSIS — N1832 Chronic kidney disease, stage 3b: Secondary | ICD-10-CM | POA: Diagnosis not present

## 2023-02-18 DIAGNOSIS — K219 Gastro-esophageal reflux disease without esophagitis: Secondary | ICD-10-CM | POA: Diagnosis not present

## 2023-02-19 LAB — LAB REPORT - SCANNED: EGFR: 62

## 2023-03-02 ENCOUNTER — Ambulatory Visit: Payer: Medicare Other | Admitting: Physician Assistant

## 2023-03-10 ENCOUNTER — Ambulatory Visit (INDEPENDENT_AMBULATORY_CARE_PROVIDER_SITE_OTHER): Payer: Medicare Other | Admitting: Physician Assistant

## 2023-03-10 ENCOUNTER — Encounter: Payer: Self-pay | Admitting: Physician Assistant

## 2023-03-10 DIAGNOSIS — F411 Generalized anxiety disorder: Secondary | ICD-10-CM | POA: Diagnosis not present

## 2023-03-10 DIAGNOSIS — F4010 Social phobia, unspecified: Secondary | ICD-10-CM

## 2023-03-10 DIAGNOSIS — F17200 Nicotine dependence, unspecified, uncomplicated: Secondary | ICD-10-CM

## 2023-03-10 DIAGNOSIS — F259 Schizoaffective disorder, unspecified: Secondary | ICD-10-CM

## 2023-03-10 MED ORDER — RISPERIDONE 4 MG PO TABS: 4.0000 mg | ORAL_TABLET | Freq: Every day | ORAL | 4 refills | Status: DC

## 2023-03-10 MED ORDER — PAROXETINE HCL 20 MG PO TABS
20.0000 mg | ORAL_TABLET | Freq: Every day | ORAL | 4 refills | Status: DC
Start: 2023-03-10 — End: 2023-08-08

## 2023-03-10 MED ORDER — RISPERIDONE 1 MG PO TABS: 1.0000 mg | ORAL_TABLET | Freq: Every day | ORAL | 4 refills | Status: DC

## 2023-03-10 MED ORDER — QUETIAPINE FUMARATE 400 MG PO TABS
ORAL_TABLET | ORAL | 4 refills | Status: DC
Start: 2023-03-10 — End: 2023-08-08

## 2023-03-10 MED ORDER — PAROXETINE HCL 10 MG PO TABS: 10.0000 mg | ORAL_TABLET | Freq: Every day | ORAL | 4 refills | Status: DC

## 2023-03-10 MED ORDER — ALPRAZOLAM 1 MG PO TABS: 1.0000 mg | ORAL_TABLET | Freq: Three times a day (TID) | ORAL | 4 refills | Status: DC | PRN

## 2023-03-10 MED ORDER — GABAPENTIN 600 MG PO TABS: 1200.0000 mg | ORAL_TABLET | Freq: Three times a day (TID) | ORAL | 4 refills | Status: DC

## 2023-03-10 NOTE — Progress Notes (Signed)
[facility]  Patient ID: Reginald Dunn,  MRN: [id number]  PCP: Sharlene Dory, DO  Date of Evaluation: 03/10/2023 Time spent:20 minutes  Chief Complaint:  Chief Complaint   Anxiety; Depression; Follow-up    HISTORY/CURRENT STATUS: HPI  For routine med check. Mom, [name] is with him.   Doing well over all.  Has his ups and downs but mostly good days.  He still does not like to be around other people, he gets extremely anxious if he has to.  He still feels overwhelmed a lot even if it is just him and his parents at home.  The Xanax is helpful to prevent panic attacks.  He does not do much of anything except watch TV and play on the computer.  Energy and motivation are good.  He is unable to work due to his mental health.  No extreme sadness, tearfulness, or feelings of hopelessness.  Sleeps well most of the time. ADLs and personal hygiene are normal.   Denies any changes in concentration, making decisions, or remembering things.  Appetite has not changed.  Weight is stable.  Denies suicidal or homicidal thoughts.  Patient denies increased energy with decreased need for sleep, increased talkativeness, racing thoughts, impulsivity or risky behaviors, increased spending, increased libido, grandiosity, increased irritability or anger, paranoia, or hallucinations.  Denies dizziness, syncope, seizures, numbness, tingling, tremor, tics, unsteady gait, slurred speech, confusion. Denies muscle or joint pain, stiffness, or dystonia.  Individual Medical History/ Review of Systems: Changes? :Yes    he has been taken off of famotidine, Tums, and a multivitamin by the nephrologist because he was getting too much calcium.  His kidney functions have improved.  Past medications for mental health diagnoses include: Pristiq, Celexa, lithium, Depakote, carbamazepine, Ativan, Ambien, gabapentin, Ritalin, Vyvanse, Zoloft, Wellbutrin, Latuda, Seroquel, Zyprexa, Risperdal, Lamictal, Vistaril,  BuSpar, clozapine, Klonopin, Paxil  Allergies: Patient has no known allergies.  Current Medications:  Current Outpatient Medications:    albuterol (VENTOLIN HFA) 108 (90 Base) MCG/ACT inhaler, Inhale 1 puff into the lungs every 6 (six) hours as needed for wheezing or shortness of breath., Disp: 18 g, Rfl: 0   omeprazole (PRILOSEC) 20 MG capsule, Take 20 mg by mouth daily., Disp: , Rfl:    ALPRAZolam (XANAX) 1 MG tablet, Take 1 tablet (1 mg total) by mouth 3 (three) times daily as needed. for anxiety, Disp: 90 tablet, Rfl: 4   gabapentin (NEURONTIN) 600 MG tablet, Take 2 tablets (1,200 mg total) by mouth 3 (three) times daily., Disp: 180 tablet, Rfl: 4   PARoxetine (PAXIL) 10 MG tablet, Take 1 tablet (10 mg total) by mouth daily. With the 20 mg tablet=30 mg., Disp: 30 tablet, Rfl: 4   PARoxetine (PAXIL) 20 MG tablet, Take 1 tablet (20 mg total) by mouth daily., Disp: 30 tablet, Rfl: 4   QUEtiapine (SEROQUEL) 400 MG tablet, TAKE 2 AND 1/2 TABLETS BY MOUTH EVERY NIGHT AT BEDTIME, Disp: 75 tablet, Rfl: 4   risperiDONE (RISPERDAL) 1 MG tablet, Take 1 tablet (1 mg total) by mouth at bedtime. With =5mg ., Disp: 30 tablet, Rfl: 4   risperidone (RISPERDAL) 4 MG tablet, Take 1 tablet (4 mg total) by mouth at bedtime. With 1 mg= ., Disp: 30 tablet, Rfl: 4 Medication Side Effects: none  Family Medical/ Social History: Changes? No  MENTAL HEALTH EXAM:  There were no vitals taken for this visit.There is no height or weight on file to calculate BMI.  General Appearance: Casual and Well Groomed  Eye  Contact:  Good  Speech:  Clear and Coherent and Normal Rate  Volume:  Normal  Mood:  Euthymic  Affect:  Congruent  Thought Process:  Goal Directed and Descriptions of Associations: Circumstantial  Orientation:  Full (Time, Place, and Person)  Thought Content: Logical   Suicidal Thoughts:  No  Homicidal Thoughts:  No  Memory:  WNL  Judgement:  Good  Insight:  Good  Psychomotor Activity:  Normal   Concentration:  Concentration: Good and Attention Span: Good  Recall:  Good  Fund of Knowledge: Good  Language: Good  Assets:  Desire for Improvement Financial Resources/Insurance Housing Resilience Transportation  ADL's:  Intact  Cognition: WNL  Prognosis:  Good   PCP and Nephrology follow labs.   DIAGNOSES:    ICD-10-CM   1. Schizoaffective disorder, unspecified type  F25.9 QUEtiapine (SEROQUEL) 400 MG tablet    2. Generalized anxiety disorder  F41.1 ALPRAZolam (XANAX) 1 MG tablet    gabapentin (NEURONTIN) 600 MG tablet    PARoxetine (PAXIL) 20 MG tablet    QUEtiapine (SEROQUEL) 400 MG tablet    3. Social anxiety disorder  F40.10     4. Smoker  F17.200      Receiving Psychotherapy: No   RECOMMENDATIONS:  PDMP was reviewed.  Last Xanax filled 02/09/2023.  Gabapentin filled 02/09/2023. I provided 20 minutes of face to face time during this encounter, including time spent before and after the visit in records review, medical decision making, counseling pertinent to today's visit, and charting.   He is doing well overall so no changes need to be made. Smoking cessation discussed.  He has cut back by few cigarettes per day and will continue to try to decrease the quantity he smokes over time.  He is not ready to quit completely.  Continue Xanax 1 mg, 1 p.o. 3 times daily as needed anxiety. Continue gabapentin 600 mg, 2 p.o. every morning, q. afternoon, and nightly.  Continue Paxil 30 mg daily.   (10 mg +20 mg) Continue Seroquel 400 mg, 2.5 pills nightly. Continue Risperdal 5 mg qhs. ( 4 mg +1 mg pills.) Return in 5 months.  Melony Overly, PA-C

## 2023-07-08 ENCOUNTER — Ambulatory Visit (INDEPENDENT_AMBULATORY_CARE_PROVIDER_SITE_OTHER): Payer: Medicare Other | Admitting: *Deleted

## 2023-07-08 DIAGNOSIS — Z0000 Encounter for general adult medical examination without abnormal findings: Secondary | ICD-10-CM

## 2023-07-08 NOTE — Patient Instructions (Signed)
Reginald Dunn , Thank you for taking time to come for your Medicare Wellness Visit. I appreciate your ongoing commitment to your health goals. Please review the following plan we discussed and let me know if I can assist you in the future.     This is a list of the screening recommended for you and due dates:  Health Maintenance  Topic Date Due   DTaP/Tdap/Td vaccine (1 - Tdap) Never done   COVID-19 Vaccine (4 - 2023-24 season) 07/17/2022   Flu Shot  06/17/2023   Medicare Annual Wellness Visit  07/07/2024   Hepatitis C Screening  Completed   HIV Screening  Completed   HPV Vaccine  Aged Out     Next appointment: Follow up in one year for your annual wellness visit.  Preventive Care 40-64 Years, Male Preventive care refers to lifestyle choices and visits with your health care provider that can promote health and wellness. What does preventive care include? A yearly physical exam. This is also called an annual well check. Dental exams once or twice a year. Routine eye exams. Ask your health care provider how often you should have your eyes checked. Personal lifestyle choices, including: Daily care of your teeth and gums. Regular physical activity. Eating a healthy diet. Avoiding tobacco and drug use. Limiting alcohol use. Practicing safe sex. Taking low-dose aspirin every day starting at age 60. What happens during an annual well check? The services and screenings done by your health care provider during your annual well check will depend on your age, overall health, lifestyle risk factors, and family history of disease. Counseling  Your health care provider may ask you questions about your: Alcohol use. Tobacco use. Drug use. Emotional well-being. Home and relationship well-being. Sexual activity. Eating habits. Work and work Astronomer. Screening  You may have the following tests or measurements: Height, weight, and BMI. Blood pressure. Lipid and cholesterol levels.  These may be checked every 5 years, or more frequently if you are over 30 years old. Skin check. Lung cancer screening. You may have this screening every year starting at age 5 if you have a 30-pack-year history of smoking and currently smoke or have quit within the past 15 years. Fecal occult blood test (FOBT) of the stool. You may have this test every year starting at age 41. Flexible sigmoidoscopy or colonoscopy. You may have a sigmoidoscopy every 5 years or a colonoscopy every 10 years starting at age 61. Prostate cancer screening. Recommendations will vary depending on your family history and other risks. Hepatitis C blood test. Hepatitis B blood test. Sexually transmitted disease (STD) testing. Diabetes screening. This is done by checking your blood sugar (glucose) after you have not eaten for a while (fasting). You may have this done every 1-3 years. Discuss your test results, treatment options, and if necessary, the need for more tests with your health care provider. Vaccines  Your health care provider may recommend certain vaccines, such as: Influenza vaccine. This is recommended every year. Tetanus, diphtheria, and acellular pertussis (Tdap, Td) vaccine. You may need a Td booster every 10 years. Zoster vaccine. You may need this after age 59. Pneumococcal 13-valent conjugate (PCV13) vaccine. You may need this if you have certain conditions and have not been vaccinated. Pneumococcal polysaccharide (PPSV23) vaccine. You may need one or two doses if you smoke cigarettes or if you have certain conditions. Talk to your health care provider about which screenings and vaccines you need and how often you need them. This  information is not intended to replace advice given to you by your health care provider. Make sure you discuss any questions you have with your health care provider. Document Released: 11/29/2015 Document Revised: 07/22/2016 Document Reviewed: 09/03/2015 Elsevier Interactive  Patient Education  2017 [employer].  Fall Prevention in the Home Falls can cause injuries. They can happen to people of all ages. There are many things you can do to make your home safe and to help prevent falls. What can I do on the outside of my home? Regularly fix the edges of walkways and driveways and fix any cracks. Remove anything that might make you trip as you walk through a door, such as a raised step or threshold. Trim any bushes or trees on the path to your home. Use bright outdoor lighting. Clear any walking paths of anything that might make someone trip, such as rocks or tools. Regularly check to see if handrails are loose or broken. Make sure that both sides of any steps have handrails. Any raised decks and porches should have guardrails on the edges. Have any leaves, snow, or ice cleared regularly. Use sand or salt on walking paths during winter. Clean up any spills in your garage right away. This includes oil or grease spills. What can I do in the bathroom? Use night lights. Install grab bars by the toilet and in the tub and shower. Do not use towel bars as grab bars. Use non-skid mats or decals in the tub or shower. If you need to sit down in the shower, use a plastic, non-slip stool. Keep the floor dry. Clean up any water that spills on the floor as soon as it happens. Remove soap buildup in the tub or shower regularly. Attach bath mats securely with double-sided non-slip rug tape. Do not have throw rugs and other things on the floor that can make you trip. What can I do in the bedroom? Use night lights. Make sure that you have a light by your bed that is easy to reach. Do not use any sheets or blankets that are too big for your bed. They should not hang down onto the floor. Have a firm chair that has side arms. You can use this for support while you get dressed. Do not have throw rugs and other things on the floor that can make you trip. What can I do in the  kitchen? Clean up any spills right away. Avoid walking on wet floors. Keep items that you use a lot in easy-to-reach places. If you need to reach something above you, use a strong step stool that has a grab bar. Keep electrical cords out of the way. Do not use floor polish or wax that makes floors slippery. If you must use wax, use non-skid floor wax. Do not have throw rugs and other things on the floor that can make you trip. What can I do with my stairs? Do not leave any items on the stairs. Make sure that there are handrails on both sides of the stairs and use them. Fix handrails that are broken or loose. Make sure that handrails are as long as the stairways. Check any carpeting to make sure that it is firmly attached to the stairs. Fix any carpet that is loose or worn. Avoid having throw rugs at the top or bottom of the stairs. If you do have throw rugs, attach them to the floor with carpet tape. Make sure that you have a light switch at the top  of the stairs and the bottom of the stairs. If you do not have them, ask someone to add them for you. What else can I do to help prevent falls? Wear shoes that: Do not have high heels. Have rubber bottoms. Are comfortable and fit you well. Are closed at the toe. Do not wear sandals. If you use a stepladder: Make sure that it is fully opened. Do not climb a closed stepladder. Make sure that both sides of the stepladder are locked into place. Ask someone to hold it for you, if possible. Clearly mark and make sure that you can see: Any grab bars or handrails. First and last steps. Where the edge of each step is. Use tools that help you move around (mobility aids) if they are needed. These include: Canes. Walkers. Scooters. Crutches. Turn on the lights when you go into a dark area. Replace any light bulbs as soon as they burn out. Set up your furniture so you have a clear path. Avoid moving your furniture around. If any of your floors are  uneven, fix them. If there are any pets around you, be aware of where they are. Review your medicines with your doctor. Some medicines can make you feel dizzy. This can increase your chance of falling. Ask your doctor what other things that you can do to help prevent falls. This information is not intended to replace advice given to you by your health care provider. Make sure you discuss any questions you have with your health care provider. Document Released: 08/29/2009 Document Revised: 04/09/2016 Document Reviewed: 12/07/2014 Elsevier Interactive Patient Education  2017 [employer].

## 2023-07-08 NOTE — Progress Notes (Signed)
Subjective:   Reginald Dunn is a 42 y.o. male who presents for Medicare Annual/Subsequent preventive examination.  Visit Complete: Virtual  I connected with  Reginald Dunn on 07/08/23 by a audio enabled telemedicine application and verified that I am speaking with the correct person using two identifiers.  Patient Location: Home  Provider Location: Office/Clinic  I discussed the limitations of evaluation and management by telemedicine. The patient expressed understanding and agreed to proceed.  Patient Medicare AWV questionnaire was completed by the patient on 07/07/23; I have confirmed that all information answered by patient is correct and no changes since this date.  Review of Systems     Cardiac Risk Factors include: male gender;smoking/ tobacco exposure     Objective:    Vital Signs: Unable to obtain new vitals due to this being a telehealth visit.      07/08/2023    8:23 AM 12/13/2018   10:22 AM  Advanced Directives  Does Patient Have a Medical Advance Directive? No No  Would patient like information on creating a medical advance directive? No - Patient declined No - Patient declined    Current Medications (verified) Outpatient Encounter Medications as of 07/08/2023  Medication Sig   albuterol (VENTOLIN HFA) 108 (90 Base) MCG/ACT inhaler Inhale 1 puff into the lungs every 6 (six) hours as needed for wheezing or shortness of breath.   ALPRAZolam (XANAX) 1 MG tablet Take 1 tablet (1 mg total) by mouth 3 (three) times daily as needed. for anxiety   gabapentin (NEURONTIN) 600 MG tablet Take 2 tablets (1,200 mg total) by mouth 3 (three) times daily.   omeprazole (PRILOSEC) 20 MG capsule Take 20 mg by mouth daily.   PARoxetine (PAXIL) 10 MG tablet Take 1 tablet (10 mg total) by mouth daily. With the 20 mg tablet=30 mg.   PARoxetine (PAXIL) 20 MG tablet Take 1 tablet (20 mg total) by mouth daily.   QUEtiapine (SEROQUEL) 400 MG tablet TAKE 2 AND 1/2 TABLETS BY MOUTH EVERY  NIGHT AT BEDTIME   risperiDONE (RISPERDAL) 1 MG tablet Take 1 tablet (1 mg total) by mouth at bedtime. With 4mg =5mg .   risperidone (RISPERDAL) 4 MG tablet Take 1 tablet (4 mg total) by mouth at bedtime. With 1 mg= 5mg .   No facility-administered encounter medications on file as of 07/08/2023.    Allergies (verified) Patient has no known allergies.   History: Past Medical History:  Diagnosis Date   ADHD    Allergy    seasonal   Blood transfusion without reported diagnosis    2017   Depression    GERD (gastroesophageal reflux disease)    Social anxiety disorder    Past Surgical History:  Procedure Laterality Date   HERNIA REPAIR  2015   HIATAL HERNIA REPAIR  08/2016   Family History  Problem Relation Age of Onset   Arthritis Mother    Hearing loss Mother    Colon cancer Neg Hx    Colon polyps Neg Hx    Esophageal cancer Neg Hx    Pancreatic cancer Neg Hx    Prostate cancer Neg Hx    Rectal cancer Neg Hx    Social History   Socioeconomic History   Marital status: Single    Spouse name: Not on file   Number of children: Not on file   Years of education: Not on file   Highest education level: Not on file  Occupational History   Not on file  Tobacco Use  Smoking status: Every Day    Current packs/day: 0.50    Average packs/day: 0.5 packs/day for 20.0 years (10.0 ttl pk-yrs)    Types: Cigarettes   Smokeless tobacco: Never  Vaping Use   Vaping status: Never Used  Substance and Sexual Activity   Alcohol use: Not Currently    Alcohol/week: 1.0 standard drink of alcohol    Types: 1 Cans of beer per week    Comment: socially q3 months   Drug use: Never   Sexual activity: Not Currently  Other Topics Concern   Not on file  Social History Narrative   Not on file   Social Determinants of Health   Financial Resource Strain: Low Risk  (07/07/2023)   Overall Financial Resource Strain (CARDIA)    Difficulty of Paying Living Expenses: Not hard at all  Food  Insecurity: No Food Insecurity (07/07/2023)   Hunger Vital Sign    Worried About Running Out of Food in the Last Year: Never true    Ran Out of Food in the Last Year: Never true  Transportation Needs: No Transportation Needs (07/07/2023)   PRAPARE - Administrator, Civil Service (Medical): No    Lack of Transportation (Non-Medical): No  Physical Activity: Inactive (07/07/2023)   Exercise Vital Sign    Days of Exercise per Week: 0 days    Minutes of Exercise per Session: 0 min  Stress: No Stress Concern Present (07/07/2023)   [employer] of Occupational Health - Occupational Stress Questionnaire    Feeling of Stress : Not at all  Social Connections: Unknown (07/07/2023)   Social Connection and Isolation Panel [NHANES]    Frequency of Communication with Friends and Family: Never    Frequency of Social Gatherings with Friends and Family: Patient declined    Attends Religious Services: Not on Marketing executive or Organizations: No    Attends Banker Meetings: Never    Marital Status: Never married    Tobacco Counseling Ready to quit: No Counseling given: Not Answered   Clinical Intake:  Pre-visit preparation completed: Yes  Pain : No/denies pain  Nutritional Risks: None Diabetes: No  How often do you need to have someone help you when you read instructions, pamphlets, or other written materials from your doctor or pharmacy?: 1 - Never  Interpreter Needed?: No  Information entered by :: [employer], CMA   Activities of Daily Living    07/07/2023    6:07 PM  In your present state of health, do you have any difficulty performing the following activities:  Hearing? 0  Vision? 0  Difficulty concentrating or making decisions? 0  Walking or climbing stairs? 0  Dressing or bathing? 0  Doing errands, shopping? 0  Preparing Food and eating ? N  Using the Toilet? N  In the past six months, have you accidently leaked urine? N  Do you  have problems with loss of bowel control? N  Managing your Medications? N  Managing your Finances? N  Housekeeping or managing your Housekeeping? N    Patient Care Team: Sharlene Dory, DO as PCP - General (Family Medicine)  Indicate any recent Medical Services you may have received from other than [facility] providers in the past year (date may be approximate).     Assessment:   This is a routine wellness examination for Reginald Dunn.  Hearing/Vision screen No results found.  Dietary issues and exercise activities discussed:     Goals Addressed  None    Depression Screen    07/08/2023    8:23 AM 06/30/2022    8:00 AM 06/24/2021    9:15 AM  PHQ 2/9 Scores  PHQ - 2 Score 2 0 0  PHQ- 9 Score  2 0    Fall Risk    07/07/2023    6:07 PM 06/30/2022    8:00 AM 06/24/2021    9:15 AM  Fall Risk   Falls in the past year? 0 0 0  Number falls in past yr: 0 0 0  Injury with Fall? 0 0 0  Risk for fall due to : No Fall Risks No Fall Risks No Fall Risks  Follow up Falls evaluation completed Falls evaluation completed Falls evaluation completed    MEDICARE RISK AT HOME: Medicare Risk at Home Any stairs in or around the home?: Yes If so, are there any without handrails?: No Home free of loose throw rugs in walkways, pet beds, electrical cords, etc?: Yes Adequate lighting in your home to reduce risk of falls?: Yes Life alert?: No Use of a cane, walker or w/c?: No Grab bars in the bathroom?: No Shower chair or bench in shower?: No Elevated toilet seat or a handicapped toilet?: No  TIMED UP AND GO:  Was the test performed?  No    Cognitive Function:        07/08/2023    8:24 AM  6CIT Screen  What Year? 0 points  What month? 0 points  What time? 0 points  Count back from 20 0 points  Months in reverse 0 points  Repeat phrase 0 points  Total Score 0 points    Immunizations Immunization History  Administered Date(s) Administered   PFIZER(Purple Top)SARS-COV-2  Vaccination 02/29/2020, 03/25/2020, 10/06/2020    TDAP status: Due, Education has been provided regarding the importance of this vaccine. Advised may receive this vaccine at local pharmacy or Health Dept. Aware to provide a copy of the vaccination record if obtained from local pharmacy or Health Dept. Verbalized acceptance and understanding.  Flu Vaccine status: Due, Education has been provided regarding the importance of this vaccine. Advised may receive this vaccine at local pharmacy or Health Dept. Aware to provide a copy of the vaccination record if obtained from local pharmacy or Health Dept. Verbalized acceptance and understanding.   Covid-19 vaccine status: Information provided on how to obtain vaccines.   Qualifies for Shingles Vaccine? No     Screening Tests Health Maintenance  Topic Date Due   DTaP/Tdap/Td (1 - Tdap) Never done   COVID-19 Vaccine (4 - 2023-24 season) 07/17/2022   Medicare Annual Wellness (AWV)  07/01/2023   INFLUENZA VACCINE  06/17/2023   Hepatitis C Screening  Completed   HIV Screening  Completed   HPV VACCINES  Aged Out    Health Maintenance  Health Maintenance Due  Topic Date Due   DTaP/Tdap/Td (1 - Tdap) Never done   COVID-19 Vaccine (4 - 2023-24 season) 07/17/2022   Medicare Annual Wellness (AWV)  07/01/2023   INFLUENZA VACCINE  06/17/2023    Colorectal cancer screen: due at age 3  Lung Cancer Screening: (Low Dose CT Chest recommended if Age 17-80 years, 20 pack-year currently smoking OR have quit w/in 15years.) does not qualify.    Additional Screening:  Hepatitis C Screening: does qualify; Completed 06/24/21  Vision Screening: Recommended annual ophthalmology exams for early detection of glaucoma and other disorders of the eye. Is the patient up to date with their annual eye  exam?  Yes  Who is the provider or what is the name of the office in which the patient attends annual eye exams? [facility] If pt is not established with a  provider, would they like to be referred to a provider to establish care? No .   Dental Screening: Recommended annual dental exams for proper oral hygiene  Diabetic Foot Exam: N/a  Community Resource Referral / Chronic Care Management: CRR required this visit?  No   CCM required this visit?  No     Plan:     I have personally reviewed and noted the following in the patient's chart:   Medical and social history Use of alcohol, tobacco or illicit drugs  Current medications and supplements including opioid prescriptions. Patient is not currently taking opioid prescriptions. Functional ability and status Nutritional status Physical activity Advanced directives List of other physicians Hospitalizations, surgeries, and ER visits in previous 12 months Vitals Screenings to include cognitive, depression, and falls Referrals and appointments  In addition, I have reviewed and discussed with patient certain preventive protocols, quality metrics, and best practice recommendations. A written personalized care plan for preventive services as well as general preventive health recommendations were provided to patient.     Donne Anon, CMA   07/08/2023   After Visit Summary: (MyChart) Due to this being a telephonic visit, the after visit summary with patients personalized plan was offered to patient via MyChart   Nurse Notes: None

## 2023-08-05 ENCOUNTER — Other Ambulatory Visit: Payer: Self-pay | Admitting: Physician Assistant

## 2023-08-05 DIAGNOSIS — F411 Generalized anxiety disorder: Secondary | ICD-10-CM

## 2023-08-05 DIAGNOSIS — F259 Schizoaffective disorder, unspecified: Secondary | ICD-10-CM

## 2023-08-05 NOTE — Telephone Encounter (Signed)
Has an appt Monday. Check to see if he has enough meds to last until then.

## 2023-08-10 ENCOUNTER — Encounter: Payer: Self-pay | Admitting: Physician Assistant

## 2023-08-10 ENCOUNTER — Ambulatory Visit (INDEPENDENT_AMBULATORY_CARE_PROVIDER_SITE_OTHER): Payer: Medicare Other | Admitting: Physician Assistant

## 2023-08-10 DIAGNOSIS — F17200 Nicotine dependence, unspecified, uncomplicated: Secondary | ICD-10-CM | POA: Diagnosis not present

## 2023-08-10 DIAGNOSIS — F259 Schizoaffective disorder, unspecified: Secondary | ICD-10-CM

## 2023-08-10 DIAGNOSIS — F411 Generalized anxiety disorder: Secondary | ICD-10-CM | POA: Diagnosis not present

## 2023-08-10 MED ORDER — GABAPENTIN 600 MG PO TABS
1200.0000 mg | ORAL_TABLET | Freq: Three times a day (TID) | ORAL | 5 refills | Status: DC
Start: 2023-08-10 — End: 2024-02-07

## 2023-08-10 MED ORDER — PAROXETINE HCL 20 MG PO TABS: 20.0000 mg | ORAL_TABLET | Freq: Every day | ORAL | 5 refills | Status: DC

## 2023-08-10 MED ORDER — RISPERIDONE 1 MG PO TABS: 1.0000 mg | ORAL_TABLET | Freq: Every day | ORAL | 5 refills | Status: DC

## 2023-08-10 MED ORDER — PAROXETINE HCL 10 MG PO TABS: 10.0000 mg | ORAL_TABLET | Freq: Every day | ORAL | 5 refills | Status: DC

## 2023-08-10 MED ORDER — QUETIAPINE FUMARATE 400 MG PO TABS
ORAL_TABLET | ORAL | 5 refills | Status: DC
Start: 2023-08-10 — End: 2024-02-07

## 2023-08-10 MED ORDER — RISPERIDONE 4 MG PO TABS: ORAL_TABLET | ORAL | 5 refills | Status: DC

## 2023-08-10 NOTE — Progress Notes (Signed)
[facility]  Patient ID: Reginald Dunn,  MRN: [id number]  PCP: Sharlene Dory, DO  Date of Evaluation: 08/10/2023 Time spent:20 minutes  Chief Complaint:  Chief Complaint   Follow-up    HISTORY/CURRENT STATUS: HPI  For routine med check. Mom, [name] is with him.   Reginald Dunn is doing well. Likes being at home but that's not new. They had fm visit over the summer and he talked with them and had a good time. But he'd need to get away and have some time to himself. He'd go to his room for awhile and then be ok. Still smoking cigs and not ready to quit.   Energy and motivation are good. Unable to work d/t mental health.  No extreme sadness, tearfulness, or feelings of hopelessness.  Sleeps well most of the time. ADLs and personal hygiene are normal.   Denies any changes in concentration, making decisions, or remembering things.  Appetite has not changed.  Weight is stable.  Xanax is still helpful. He gets overwhelmed, esp when people are around. No PA. Denies suicidal or homicidal thoughts.  Patient denies increased energy with decreased need for sleep, increased talkativeness, racing thoughts, impulsivity or risky behaviors, increased spending, increased libido, grandiosity, increased irritability or anger, paranoia, or hallucinations.  Denies dizziness, syncope, seizures, numbness, tingling, tremor, tics, unsteady gait, slurred speech, confusion. Denies muscle or joint pain, stiffness, or dystonia. Denies unexplained weight loss, frequent infections, or sores that heal slowly.  No polyphagia, polydipsia, or polyuria. Denies visual changes or paresthesias.   Individual Medical History/ Review of Systems: Changes? :No       Past medications for mental health diagnoses include: Pristiq, Celexa, lithium, Depakote, carbamazepine, Ativan, Ambien, gabapentin, Ritalin, Vyvanse, Zoloft, Wellbutrin, Latuda, Seroquel, Zyprexa, Risperdal, Lamictal, Vistaril, BuSpar, clozapine,  Klonopin, Paxil  Allergies: Patient has no known allergies.  Current Medications:  Current Outpatient Medications:    albuterol (VENTOLIN HFA) 108 (90 Base) MCG/ACT inhaler, Inhale 1 puff into the lungs every 6 (six) hours as needed for wheezing or shortness of breath., Disp: 18 g, Rfl: 0   ALPRAZolam (XANAX) 1 MG tablet, TAKE 1 TABLET BY MOUTH 3 TIMES DAILY AS NEEDED FOR ANXIETY, Disp: 90 tablet, Rfl: 5   omeprazole (PRILOSEC) 20 MG capsule, Take 20 mg by mouth daily., Disp: , Rfl:    gabapentin (NEURONTIN) 600 MG tablet, Take 2 tablets (1,200 mg total) by mouth 3 (three) times daily., Disp: 180 tablet, Rfl: 5   PARoxetine (PAXIL) 10 MG tablet, Take 1 tablet (10 mg total) by mouth daily., Disp: 30 tablet, Rfl: 5   PARoxetine (PAXIL) 20 MG tablet, Take 1 tablet (20 mg total) by mouth daily., Disp: 30 tablet, Rfl: 5   QUEtiapine (SEROQUEL) 400 MG tablet, TAKE 2 AND 1/2 TABLETS BY MOUTH EVERY NIGHT AT BEDTIME, Disp: 75 tablet, Rfl: 5   risperiDONE (RISPERDAL) 1 MG tablet, Take 1 tablet (1 mg total) by mouth at bedtime. With 4 mg=5 mg qhs, Disp: 30 tablet, Rfl: 5   risperidone (RISPERDAL) 4 MG tablet, 1 qhs w/ 1mg  =5 mg, Disp: 30 tablet, Rfl: 5 Medication Side Effects: none  Family Medical/ Social History: Changes? No  MENTAL HEALTH EXAM:  There were no vitals taken for this visit.There is no height or weight on file to calculate BMI.  General Appearance: Casual and Well Groomed  Eye Contact:  Good  Speech:  Clear and Coherent and Normal Rate  Volume:  Normal  Mood:  Euthymic  Affect:  Congruent  Thought Process:  Goal Directed and Descriptions of Associations: Circumstantial  Orientation:  Full (Time, Place, and Person)  Thought Content: Logical   Suicidal Thoughts:  No  Homicidal Thoughts:  No  Memory:  WNL  Judgement:  Good  Insight:  Good  Psychomotor Activity:  Normal  Concentration:  Concentration: Good and Attention Span: Good  Recall:  Good  Fund of Knowledge: Good   Language: Good  Assets:  Desire for Improvement Financial Resources/Insurance Housing Resilience Social Support Transportation  ADL's:  Intact  Cognition: WNL  Prognosis:  Good   PCP and Nephrology follow labs.   DIAGNOSES:    ICD-10-CM   1. Schizoaffective disorder, unspecified type (HCC)  F25.9 QUEtiapine (SEROQUEL) 400 MG tablet    2. Generalized anxiety disorder  F41.1 gabapentin (NEURONTIN) 600 MG tablet    PARoxetine (PAXIL) 20 MG tablet    QUEtiapine (SEROQUEL) 400 MG tablet    3. Smoker unmotivated to quit  F17.200      Receiving Psychotherapy: No   RECOMMENDATIONS:  PDMP was reviewed.  Last Xanax filled 07/06/2023.  Gabapentin filled 07/07/2023. I provided 20 minutes of face to face time during this encounter, including time spent before and after the visit in records review, medical decision making, counseling pertinent to today's visit, and charting.   He's doing well overall so no changes need to be made. Smoking cessation discussed.   Continue Xanax 1 mg, 1 p.o. 3 times daily as needed anxiety. Continue gabapentin 600 mg, 2 p.o. every morning, q. afternoon, and nightly.  Continue Paxil 30 mg daily.   (10 mg +20 mg) Continue Seroquel 400 mg, 2.5 pills nightly. Continue Risperdal 5 mg qhs. ( 4 mg +1 mg pills.) Return in 6 months.  Melony Overly, PA-C

## 2023-11-03 ENCOUNTER — Other Ambulatory Visit: Payer: Self-pay

## 2023-11-03 ENCOUNTER — Encounter: Payer: Self-pay | Admitting: Family Medicine

## 2023-11-03 ENCOUNTER — Other Ambulatory Visit: Payer: Self-pay | Admitting: Family Medicine

## 2023-11-03 ENCOUNTER — Ambulatory Visit (INDEPENDENT_AMBULATORY_CARE_PROVIDER_SITE_OTHER): Payer: Medicare Other | Admitting: Family Medicine

## 2023-11-03 VITALS — BP 112/74 | HR 98 | Temp 98.0°F | Resp 16 | Ht 71.0 in | Wt 179.8 lb

## 2023-11-03 DIAGNOSIS — E559 Vitamin D deficiency, unspecified: Secondary | ICD-10-CM | POA: Diagnosis not present

## 2023-11-03 DIAGNOSIS — E785 Hyperlipidemia, unspecified: Secondary | ICD-10-CM

## 2023-11-03 DIAGNOSIS — J4520 Mild intermittent asthma, uncomplicated: Secondary | ICD-10-CM

## 2023-11-03 DIAGNOSIS — Z23 Encounter for immunization: Secondary | ICD-10-CM

## 2023-11-03 LAB — CBC
HCT: 42 % (ref 39.0–52.0)
Hemoglobin: 14.4 g/dL (ref 13.0–17.0)
MCHC: 34.4 g/dL (ref 30.0–36.0)
MCV: 96.6 fL (ref 78.0–100.0)
Platelets: 258 10*3/uL (ref 150.0–400.0)
RBC: 4.35 Mil/uL (ref 4.22–5.81)
RDW: 14 % (ref 11.5–15.5)
WBC: 6.1 10*3/uL (ref 4.0–10.5)

## 2023-11-03 LAB — COMPREHENSIVE METABOLIC PANEL
ALT: 17 U/L (ref 0–53)
AST: 14 U/L (ref 0–37)
Albumin: 4.4 g/dL (ref 3.5–5.2)
Alkaline Phosphatase: 98 U/L (ref 39–117)
BUN: 16 mg/dL (ref 6–23)
CO2: 25 meq/L (ref 19–32)
Calcium: 9.7 mg/dL (ref 8.4–10.5)
Chloride: 103 meq/L (ref 96–112)
Creatinine, Ser: 1.29 mg/dL (ref 0.40–1.50)
GFR: 67.95 mL/min (ref >60.00)
Glucose, Bld: 88 mg/dL (ref 70–99)
Potassium: 4.3 meq/L (ref 3.5–5.1)
Sodium: 135 meq/L (ref 135–145)
Total Bilirubin: 0.4 mg/dL (ref 0.2–1.2)
Total Protein: 6.7 g/dL (ref 6.0–8.3)

## 2023-11-03 LAB — LIPID PANEL
Cholesterol: 227 mg/dL — ABNORMAL HIGH (ref 0–200)
HDL: 70.3 mg/dL (ref >39.00)
LDL Cholesterol: 136 mg/dL — ABNORMAL HIGH (ref 0–99)
NonHDL: 156.82
Total CHOL/HDL Ratio: 3
Triglycerides: 102 mg/dL (ref 0.0–149.0)
VLDL: 20.4 mg/dL (ref 0.0–40.0)

## 2023-11-03 LAB — VITAMIN D 25 HYDROXY (VIT D DEFICIENCY, FRACTURES): VITD: 18.11 ng/mL — ABNORMAL LOW (ref 30.00–100.00)

## 2023-11-03 MED ORDER — ALBUTEROL SULFATE HFA 108 (90 BASE) MCG/ACT IN AERS: 1.0000 | INHALATION_SPRAY | Freq: Four times a day (QID) | RESPIRATORY_TRACT | 0 refills | Status: AC | PRN

## 2023-11-03 MED ORDER — VITAMIN D (ERGOCALCIFEROL) 1.25 MG (50000 UNIT) PO CAPS: 50000.0000 [IU] | ORAL_CAPSULE | ORAL | 0 refills | Status: DC

## 2023-11-03 NOTE — Patient Instructions (Signed)
Give [place] 2-3 business days to get the results of your labs back.   Keep the diet clean and stay active.  Please stop smoking.  Consider getting your tetanus booster at the pharmacy.   Let us know if you need anything.

## 2023-11-03 NOTE — Progress Notes (Signed)
Chief Complaint  Patient presents with   Annual Exam    Annual Exam    Subjective: Patient is a 42 y.o. male here for f/u.  Hx of mild intermittent asthma. Uses albuterol <1/mo on average. No AE's. Smokes 1 ppd.  He has never had the pneumonia vaccine.  No recent hospitalization, steroid use, shortness of breath, wheezing, chest pain, or coughing.  Past Medical History:  Diagnosis Date   ADHD    Allergy    seasonal   Blood transfusion without reported diagnosis    2017   Depression    GERD (gastroesophageal reflux disease)    Social anxiety disorder     Objective: BP 112/74   Pulse 98   Temp 98 F (36.7 C) (Oral)   Resp 16   Ht 5\' 11"  (1.803 m)   Wt 179 lb 12.8 oz (81.6 kg)   SpO2 98%   BMI 25.08 kg/m  General: Awake, appears stated age Heart: RRR, no LE edema Lungs: CTAB, no rales, wheezes or rhonchi. No accessory muscle use Psych: Age appropriate judgment and insight, normal affect and mood  Assessment and Plan: Mild intermittent asthma without complication  Hypovitaminosis D - Plan: VITAMIN D 25 Hydroxy (Vit-D Deficiency, Fractures)  Hyperlipidemia, unspecified hyperlipidemia type - Plan: CBC, Comprehensive metabolic panel, Lipid panel  Chronic, stable.  Continue albuterol as needed.  PCV 20 today. Follow-up on vitamin D levels. Follow-up on above. Flu shot politely declined.  Tetanus booster recommended at the pharmacy. Follow-up in 1 year for med check or as needed. The patient voiced understanding and agreement to the plan.  Jilda Roche [place], DO 11/03/23  8:14 AM

## 2024-01-17 ENCOUNTER — Other Ambulatory Visit: Payer: Medicare Other

## 2024-01-24 ENCOUNTER — Encounter: Payer: Self-pay | Admitting: Family Medicine

## 2024-01-24 ENCOUNTER — Other Ambulatory Visit (INDEPENDENT_AMBULATORY_CARE_PROVIDER_SITE_OTHER)

## 2024-01-24 ENCOUNTER — Other Ambulatory Visit: Payer: Self-pay | Admitting: Family Medicine

## 2024-01-24 DIAGNOSIS — E559 Vitamin D deficiency, unspecified: Secondary | ICD-10-CM | POA: Diagnosis not present

## 2024-01-24 LAB — VITAMIN D 25 HYDROXY (VIT D DEFICIENCY, FRACTURES): VITD: 29.06 ng/mL — ABNORMAL LOW (ref 30.00–100.00)

## 2024-02-05 ENCOUNTER — Other Ambulatory Visit: Payer: Self-pay | Admitting: Physician Assistant

## 2024-02-05 DIAGNOSIS — F259 Schizoaffective disorder, unspecified: Secondary | ICD-10-CM

## 2024-02-05 DIAGNOSIS — F411 Generalized anxiety disorder: Secondary | ICD-10-CM

## 2024-02-07 ENCOUNTER — Encounter: Payer: Self-pay | Admitting: Physician Assistant

## 2024-02-07 ENCOUNTER — Ambulatory Visit (INDEPENDENT_AMBULATORY_CARE_PROVIDER_SITE_OTHER): Payer: Medicare Other | Admitting: Physician Assistant

## 2024-02-07 DIAGNOSIS — E559 Vitamin D deficiency, unspecified: Secondary | ICD-10-CM

## 2024-02-07 DIAGNOSIS — F17200 Nicotine dependence, unspecified, uncomplicated: Secondary | ICD-10-CM | POA: Diagnosis not present

## 2024-02-07 DIAGNOSIS — F411 Generalized anxiety disorder: Secondary | ICD-10-CM

## 2024-02-07 DIAGNOSIS — F259 Schizoaffective disorder, unspecified: Secondary | ICD-10-CM

## 2024-02-07 DIAGNOSIS — F4010 Social phobia, unspecified: Secondary | ICD-10-CM

## 2024-02-07 MED ORDER — RISPERIDONE 4 MG PO TABS: ORAL_TABLET | ORAL | 5 refills | Status: DC

## 2024-02-07 MED ORDER — GABAPENTIN 600 MG PO TABS: 1200.0000 mg | ORAL_TABLET | Freq: Three times a day (TID) | ORAL | 5 refills | Status: DC

## 2024-02-07 MED ORDER — PAROXETINE HCL 10 MG PO TABS: 10.0000 mg | ORAL_TABLET | Freq: Every day | ORAL | 5 refills | Status: DC

## 2024-02-07 MED ORDER — PAROXETINE HCL 20 MG PO TABS: 20.0000 mg | ORAL_TABLET | Freq: Every day | ORAL | 5 refills | Status: DC

## 2024-02-07 MED ORDER — QUETIAPINE FUMARATE 400 MG PO TABS: ORAL_TABLET | ORAL | 5 refills | Status: DC

## 2024-02-07 MED ORDER — ALPRAZOLAM 1 MG PO TABS: 1.0000 mg | ORAL_TABLET | Freq: Three times a day (TID) | ORAL | 5 refills | Status: DC | PRN

## 2024-02-07 MED ORDER — RISPERIDONE 1 MG PO TABS: 1.0000 mg | ORAL_TABLET | Freq: Every day | ORAL | 5 refills | Status: DC

## 2024-02-07 NOTE — Progress Notes (Signed)
 [facility]  Patient ID: Reginald Dunn,  MRN: [id number]  PCP: Sharlene Dory, DO  Date of Evaluation: 02/07/2024 Time spent:30 minutes  Chief Complaint:  Chief Complaint   Anxiety; Depression; Follow-up    HISTORY/CURRENT STATUS: HPI  For routine med check. Mom, [name] is with him.   Lost his beloved dog end of Dec 18, 2023 and that's been very hard. Still cries a little bit every day but he's getting through it. Got a 5 month of puppy a few days ago.   Patient is able to enjoy things.  Energy and motivation are good.  No extreme sadness, tearfulness, or feelings of hopelessness.  Sleeps well most of the time. ADLs and personal hygiene are normal.   Denies any changes in concentration, making decisions, or remembering things.  Appetite has not changed.  Weight is stable.  He is not having panic attacks.  Still gets overwhelmed easily.  Xanax is helpful.  Denies suicidal or homicidal thoughts.  Patient denies increased energy with decreased need for sleep, increased talkativeness, racing thoughts, impulsivity or risky behaviors, increased spending, increased libido, grandiosity, increased irritability or anger, paranoia, or hallucinations.  Denies dizziness, syncope, seizures, numbness, tingling, tremor, tics, unsteady gait, slurred speech, confusion. Denies muscle or joint pain, stiffness, or dystonia. Denies unexplained weight loss, frequent infections, or sores that heal slowly.  No polyphagia, polydipsia, or polyuria. Denies visual changes or paresthesias.   Individual Medical History/ Review of Systems: Changes? :No       Past medications for mental health diagnoses include: Pristiq, Celexa, lithium, Depakote, carbamazepine, Ativan, Ambien, gabapentin, Ritalin, Vyvanse, Zoloft, Wellbutrin, Latuda, Seroquel, Zyprexa, Risperdal, Lamictal, Vistaril, BuSpar, clozapine, Klonopin, Paxil  Allergies: Patient has no known allergies.  Current Medications:  Current  Outpatient Medications:    albuterol (VENTOLIN HFA) 108 (90 Base) MCG/ACT inhaler, Inhale 1 puff into the lungs every 6 (six) hours as needed for wheezing or shortness of breath., Disp: 18 g, Rfl: 0   omeprazole (PRILOSEC) 20 MG capsule, Take 20 mg by mouth daily., Disp: , Rfl:    Vitamin D, Ergocalciferol, (DRISDOL) 1.25 MG (50000 UNIT) CAPS capsule, TAKE 1 CAPSULE BY MOUTH EVERY 7 DAYS, Disp: 12 capsule, Rfl: 0   ALPRAZolam (XANAX) 1 MG tablet, Take 1 tablet (1 mg total) by mouth 3 (three) times daily as needed for anxiety., Disp: 90 tablet, Rfl: 5   gabapentin (NEURONTIN) 600 MG tablet, Take 2 tablets (1,200 mg total) by mouth 3 (three) times daily., Disp: 180 tablet, Rfl: 5   PARoxetine (PAXIL) 10 MG tablet, Take 1 tablet (10 mg total) by mouth daily., Disp: 30 tablet, Rfl: 5   PARoxetine (PAXIL) 20 MG tablet, Take 1 tablet (20 mg total) by mouth daily., Disp: 30 tablet, Rfl: 5   QUEtiapine (SEROQUEL) 400 MG tablet, TAKE 2 AND 1/2 TABLETS BY MOUTH EVERY NIGHT AT BEDTIME, Disp: 75 tablet, Rfl: 5   risperiDONE (RISPERDAL) 1 MG tablet, Take 1 tablet (1 mg total) by mouth at bedtime. With 4 mg=5 mg qhs, Disp: 30 tablet, Rfl: 5   risperidone (RISPERDAL) 4 MG tablet, 1 qhs w/ 1mg  =5 mg, Disp: 30 tablet, Rfl: 5 Medication Side Effects: none  Family Medical/ Social History: Changes?  Dog, Chloe died, end of 2023/12/18.  Very hard. Got another lab mix, 64 month old, Jeanie Cooks only 2 days ago.   MENTAL HEALTH EXAM:  There were no vitals taken for this visit.There is no height or weight on file to calculate BMI.  General Appearance: Casual  and Well Groomed  Eye Contact:  Good  Speech:  Clear and Coherent and Normal Rate  Volume:  Normal  Mood:  Euthymic  Affect:   Tearful when talking about losing his dog.  Thought Process:  Goal Directed and Descriptions of Associations: Circumstantial  Orientation:  Full (Time, Place, and Person)  Thought Content: Logical   Suicidal Thoughts:  No  Homicidal Thoughts:  No   Memory:  WNL  Judgement:  Good  Insight:  Good  Psychomotor Activity:  Normal  Concentration:  Concentration: Good and Attention Span: Good  Recall:  Good  Fund of Knowledge: Good  Language: Good  Assets:  Desire for Improvement Financial Resources/Insurance Housing Resilience Social Support Transportation  ADL's:  Intact  Cognition: WNL  Prognosis:  Good   PCP and Nephrology follow labs.  Labs reviewed from 11/03/2023 and 01/24/2024  DIAGNOSES:    ICD-10-CM   1. Schizoaffective disorder, unspecified type (HCC)  F25.9 QUEtiapine (SEROQUEL) 400 MG tablet    2. Generalized anxiety disorder  F41.1 ALPRAZolam (XANAX) 1 MG tablet    gabapentin (NEURONTIN) 600 MG tablet    PARoxetine (PAXIL) 20 MG tablet    QUEtiapine (SEROQUEL) 400 MG tablet    3. Smoker unmotivated to quit  F17.200     4. Social anxiety disorder  F40.10     5. Hypovitaminosis D  E55.9       Receiving Psychotherapy: No   RECOMMENDATIONS:  PDMP was reviewed.  Last Xanax filled 01/06/2024.   I provided 30 minutes of face to face time during this encounter, including time spent before and after the visit in records review, medical decision making, counseling pertinent to today's visit, and charting.   My condolences and the loss of his dog.  He is doing well on current medications and no changes will be made.  Smoking cessation discussed.  He states he is not ready to quit.  Continue Xanax 1 mg, 1 p.o. 3 times daily as needed anxiety. Continue gabapentin 600 mg, 2 p.o. every morning, q. afternoon, and nightly.  Continue Paxil 30 mg daily.   (10 mg +20 mg) Continue Seroquel 400 mg, 2.5 pills nightly. Continue Risperdal 5 mg qhs. ( 4 mg +1 mg pills.) Return in 6 months.  Melony Overly, PA-C

## 2024-02-15 DIAGNOSIS — N183 Chronic kidney disease, stage 3 unspecified: Secondary | ICD-10-CM | POA: Diagnosis not present

## 2024-02-15 DIAGNOSIS — F17200 Nicotine dependence, unspecified, uncomplicated: Secondary | ICD-10-CM | POA: Diagnosis not present

## 2024-02-15 DIAGNOSIS — N182 Chronic kidney disease, stage 2 (mild): Secondary | ICD-10-CM | POA: Diagnosis not present

## 2024-02-16 LAB — LAB REPORT - SCANNED: EGFR: 69

## 2024-04-06 ENCOUNTER — Other Ambulatory Visit: Payer: Self-pay | Admitting: Family Medicine

## 2024-07-05 ENCOUNTER — Other Ambulatory Visit: Payer: Self-pay | Admitting: Family Medicine

## 2024-07-11 ENCOUNTER — Ambulatory Visit (INDEPENDENT_AMBULATORY_CARE_PROVIDER_SITE_OTHER)

## 2024-07-11 VITALS — Ht 71.0 in | Wt 179.0 lb

## 2024-07-11 DIAGNOSIS — Z0000 Encounter for general adult medical examination without abnormal findings: Secondary | ICD-10-CM

## 2024-07-11 NOTE — Patient Instructions (Signed)
 Reginald Dunn , Thank you for taking time out of your busy schedule to complete your Annual Wellness Visit with me. I enjoyed our conversation and look forward to speaking with you again next year. I, as well as your care team,  appreciate your ongoing commitment to your health goals. Please review the following plan we discussed and let me know if I can assist you in the future. Your Game plan/ To Do List    Follow up Visits: We will see or speak with you next year for your Next Medicare AWV with our clinical staff Have you seen your provider in the last 6 months (3 months if uncontrolled diabetes)? No  Clinician Recommendations:  Aim for 30 minutes of exercise or brisk walking, 6-8 glasses of water, and 5 servings of fruits and vegetables each day.       This is a list of the screenings recommended for you:  Health Maintenance  Topic Date Due   DTaP/Tdap/Td vaccine (1 - Tdap) Never done   Hepatitis B Vaccine (1 of 3 - 19+ 3-dose series) Never done   HPV Vaccine (1 - 3-dose SCDM series) Never done   COVID-19 Vaccine (4 - 2024-25 season) 07/18/2023   Flu Shot  06/16/2024   Medicare Annual Wellness Visit  07/11/2025   Hepatitis C Screening  Completed   HIV Screening  Completed   Meningitis B Vaccine  Aged Out   Pneumococcal Vaccine  Discontinued    Advanced directives: (ACP Link)Information on Advanced Care Planning can be found at [place]  Secretary of [facility] Advance Health Care Directives Advance Health Care Directives. [email or web address]   Advance Care Planning is important because it:  [x]  Makes sure you receive the medical care that is consistent with your values, goals, and preferences  [x]  It provides guidance to your family and loved ones and reduces their decisional burden about whether or not they are making the right decisions based on your wishes.  Follow the link provided in your after visit summary or read over the paperwork we have mailed to you to help you started getting  your Advance Directives in place. If you need assistance in completing these, please reach out to us  so that we can help you!  See attachments for Preventive Care and Fall Prevention Tips.

## 2024-07-11 NOTE — Progress Notes (Signed)
 Subjective:   Reginald Dunn is a 43 y.o. who presents for a Medicare Wellness preventive visit.  As a reminder, Annual Wellness Visits don't include a physical exam, and some assessments may be limited, especially if this visit is performed virtually. We may recommend an in-person follow-up visit with your provider if needed.  Visit Complete: Virtual I connected with  Reginald Dunn on 07/11/24 by a audio enabled telemedicine application and verified that I am speaking with the correct person using two identifiers.  Patient Location: Home  Provider Location: Home Office  I discussed the limitations of evaluation and management by telemedicine. The patient expressed understanding and agreed to proceed.  Vital Signs: Because this visit was a virtual/telehealth visit, some criteria may be missing or patient reported. Any vitals not documented were not able to be obtained and vitals that have been documented are patient reported.  VideoDeclined- This patient declined Librarian, academic. Therefore the visit was completed with audio only.  Persons Participating in Visit: Patient.  AWV Questionnaire: No: Patient Medicare AWV questionnaire was not completed prior to this visit.  Cardiac Risk Factors include: male gender;smoking/ tobacco exposure     Objective:    Today's Vitals   07/11/24 0952  Weight: 179 lb (81.2 kg)  Height: 5' 11 (1.803 m)   Body mass index is 24.97 kg/m.     07/11/2024    9:30 AM 07/08/2023    8:23 AM 12/13/2018   10:22 AM  Advanced Directives  Does Patient Have a Medical Advance Directive? No No No   Would patient like information on creating a medical advance directive? Yes (MAU/Ambulatory/Procedural Areas - Information given) No - Patient declined No - Patient declined      Data saved with a previous flowsheet row definition    Current Medications (verified) Outpatient Encounter Medications as of 07/11/2024  Medication  Sig   albuterol  (VENTOLIN  HFA) 108 (90 Base) MCG/ACT inhaler Inhale 1 puff into the lungs every 6 (six) hours as needed for wheezing or shortness of breath.   ALPRAZolam  (XANAX ) 1 MG tablet Take 1 tablet (1 mg total) by mouth 3 (three) times daily as needed for anxiety.   gabapentin  (NEURONTIN ) 600 MG tablet Take 2 tablets (1,200 mg total) by mouth 3 (three) times daily.   omeprazole (PRILOSEC) 20 MG capsule Take 20 mg by mouth daily.   PARoxetine  (PAXIL ) 10 MG tablet Take 1 tablet (10 mg total) by mouth daily.   PARoxetine  (PAXIL ) 20 MG tablet Take 1 tablet (20 mg total) by mouth daily.   QUEtiapine  (SEROQUEL ) 400 MG tablet TAKE 2 AND 1/2 TABLETS BY MOUTH EVERY NIGHT AT BEDTIME   risperiDONE  (RISPERDAL ) 1 MG tablet Take 1 tablet (1 mg total) by mouth at bedtime. With 4 mg=5 mg qhs   risperidone  (RISPERDAL ) 4 MG tablet 1 qhs w/ 1mg  =5 mg   Vitamin D , Ergocalciferol , (DRISDOL ) 1.25 MG (50000 UNIT) CAPS capsule TAKE 1 CAPSULE BY MOUTH EVERY 7 DAYS   No facility-administered encounter medications on file as of 07/11/2024.    Allergies (verified) Patient has no known allergies.   History: Past Medical History:  Diagnosis Date   ADHD    Allergy    seasonal   Blood transfusion without reported diagnosis    2017   Depression    GERD (gastroesophageal reflux disease)    Social anxiety disorder    Past Surgical History:  Procedure Laterality Date   HERNIA REPAIR  2015   HIATAL  HERNIA REPAIR  08/2016   Family History  Problem Relation Age of Onset   Arthritis Mother    Hearing loss Mother    Colon cancer Neg Hx    Colon polyps Neg Hx    Esophageal cancer Neg Hx    Pancreatic cancer Neg Hx    Prostate cancer Neg Hx    Rectal cancer Neg Hx    Social History   Socioeconomic History   Marital status: Single    Spouse name: Not on file   Number of children: Not on file   Years of education: Not on file   Highest education level: 12th grade  Occupational History   Not on file   Tobacco Use   Smoking status: Every Day    Current packs/day: 0.50    Average packs/day: 0.5 packs/day for 20.0 years (10.0 ttl pk-yrs)    Types: Cigarettes   Smokeless tobacco: Never  Vaping Use   Vaping status: Never Used  Substance and Sexual Activity   Alcohol use: Not Currently    Alcohol/week: 1.0 standard drink of alcohol    Types: 1 Cans of beer per week    Comment: socially q3 months   Drug use: Never   Sexual activity: Not Currently  Other Topics Concern   Not on file  Social History Narrative   Not on file   Social Drivers of Health   Financial Resource Strain: Low Risk  (07/11/2024)   Overall Financial Resource Strain (CARDIA)    Difficulty of Paying Living Expenses: Not hard at all  Food Insecurity: No Food Insecurity (07/11/2024)   Hunger Vital Sign    Worried About Running Out of Food in the Last Year: Never true    Ran Out of Food in the Last Year: Never true  Transportation Needs: No Transportation Needs (07/11/2024)   PRAPARE - Administrator, Civil Service (Medical): No    Lack of Transportation (Non-Medical): No  Physical Activity: Inactive (07/11/2024)   Exercise Vital Sign    Days of Exercise per Week: 0 days    Minutes of Exercise per Session: 0 min  Stress: No Stress Concern Present (07/11/2024)   [employer] of Occupational Health - Occupational Stress Questionnaire    Feeling of Stress: Only a little  Social Connections: Unknown (07/11/2024)   Social Connection and Isolation Panel    Frequency of Communication with Friends and Family: Never    Frequency of Social Gatherings with Friends and Family: Patient declined    Attends Religious Services: Never    Database administrator or Organizations: No    Attends Engineer, structural: Never    Marital Status: Never married    Tobacco Counseling Ready to quit: Not Answered Counseling given: Not Answered    Clinical Intake:  Pre-visit preparation completed: Yes  Pain  : No/denies pain  Diabetes: No  How often do you need to have someone help you when you read instructions, pamphlets, or other written materials from your doctor or pharmacy?: 1 - Never  Interpreter Needed?: No  Information entered by :: Charmaine Bloodgood LPN   Activities of Daily Living     07/11/2024    8:53 AM  In your present state of health, do you have any difficulty performing the following activities:  Hearing? 0  Vision? 0  Difficulty concentrating or making decisions? 0  Walking or climbing stairs? 0  Dressing or bathing? 0  Doing errands, shopping? 0  Preparing Food and eating ?  N  Using the Toilet? N  In the past six months, have you accidently leaked urine? N  Do you have problems with loss of bowel control? N  Managing your Medications? N  Managing your Finances? N  Housekeeping or managing your Housekeeping? N    Patient Care Team: Frann Mabel Mt, DO as PCP - General (Family Medicine) Rhys Verneita ONEIDA DEVONNA as Physician Assistant (Psychiatry) Dennise Hoes, MD as Consulting Physician (Nephrology)  I have updated your Care Teams any recent Medical Services you may have received from other providers in the past year.     Assessment:   This is a routine wellness examination for Reginald Dunn.  Hearing/Vision screen Hearing Screening - Comments:: Denies hearing difficulties   Vision Screening - Comments:: No vision problems; will schedule routine eye exam     Goals Addressed             This Visit's Progress    Maintain health   On track      Depression Screen     07/11/2024    9:53 AM 11/03/2023    7:53 AM 07/08/2023    8:23 AM 06/30/2022    8:00 AM 06/24/2021    9:15 AM  PHQ 2/9 Scores  PHQ - 2 Score 2 2 2  0 0  PHQ- 9 Score 2 2  2  0    Fall Risk     07/11/2024    9:55 AM 11/03/2023    7:53 AM 07/07/2023    6:07 PM 06/30/2022    8:00 AM 06/24/2021    9:15 AM  Fall Risk   Falls in the past year? 0 0 0 0 0  Number falls in past yr: 0 0 0 0 0   Injury with Fall? 0 0 0 0 0  Risk for fall due to : No Fall Risks  No Fall Risks No Fall Risks No Fall Risks  Follow up Falls prevention discussed;Education provided;Falls evaluation completed Falls evaluation completed Falls evaluation completed Falls evaluation completed  Falls evaluation completed      Data saved with a previous flowsheet row definition    MEDICARE RISK AT HOME:  Medicare Risk at Home Any stairs in or around the home?: No If so, are there any without handrails?: No Home free of loose throw rugs in walkways, pet beds, electrical cords, etc?: Yes Adequate lighting in your home to reduce risk of falls?: Yes Life alert?: No Use of a cane, walker or w/c?: No Grab bars in the bathroom?: Yes Shower chair or bench in shower?: No Elevated toilet seat or a handicapped toilet?: Yes  TIMED UP AND GO:  Was the test performed?  No  Cognitive Function: 6CIT completed        07/11/2024    9:55 AM 07/08/2023    8:24 AM  6CIT Screen  What Year? 0 points 0 points  What month? 0 points 0 points  What time? 0 points 0 points  Count back from 20 0 points 0 points  Months in reverse 0 points 0 points  Repeat phrase 0 points 0 points  Total Score 0 points 0 points    Immunizations Immunization History  Administered Date(s) Administered   PFIZER(Purple Top)SARS-COV-2 Vaccination 02/29/2020, 03/25/2020, 10/06/2020   PNEUMOCOCCAL CONJUGATE-20 11/03/2023    Screening Tests Health Maintenance  Topic Date Due   DTaP/Tdap/Td (1 - Tdap) Never done   Hepatitis B Vaccines 19-59 Average Risk (1 of 3 - 19+ 3-dose series) Never done   HPV  VACCINES (1 - 3-dose SCDM series) Never done   COVID-19 Vaccine (4 - 2024-25 season) 07/18/2023   INFLUENZA VACCINE  06/16/2024   Medicare Annual Wellness (AWV)  07/11/2025   Hepatitis C Screening  Completed   HIV Screening  Completed   Meningococcal B Vaccine  Aged Out   Pneumococcal Vaccine  Discontinued    Health Maintenance  Health  Maintenance Due  Topic Date Due   DTaP/Tdap/Td (1 - Tdap) Never done   Hepatitis B Vaccines 19-59 Average Risk (1 of 3 - 19+ 3-dose series) Never done   HPV VACCINES (1 - 3-dose SCDM series) Never done   COVID-19 Vaccine (4 - 2024-25 season) 07/18/2023   INFLUENZA VACCINE  06/16/2024    Additional Screening:  Vision Screening: Recommended annual ophthalmology exams for early detection of glaucoma and other disorders of the eye. Would you like a referral to an eye doctor? No    Dental Screening: Recommended annual dental exams for proper oral hygiene  Community Resource Referral / Chronic Care Management: CRR required this visit?  No   CCM required this visit?  No   Plan:    I have personally reviewed and noted the following in the patient's chart:   Medical and social history Use of alcohol, tobacco or illicit drugs  Current medications and supplements including opioid prescriptions. Patient is not currently taking opioid prescriptions. Functional ability and status Nutritional status Physical activity Advanced directives List of other physicians Hospitalizations, surgeries, and ER visits in previous 12 months Vitals Screenings to include cognitive, depression, and falls Referrals and appointments  In addition, I have reviewed and discussed with patient certain preventive protocols, quality metrics, and best practice recommendations. A written personalized care plan for preventive services as well as general preventive health recommendations were provided to patient.   Lavelle Pfeiffer [place], [place]   1/73/7974   After Visit Summary: (MyChart) Due to this being a telephonic visit, the after visit summary with patients personalized plan was offered to patient via MyChart   Notes: Nothing significant to report at this time.

## 2024-07-28 ENCOUNTER — Ambulatory Visit: Payer: Self-pay

## 2024-07-28 NOTE — Telephone Encounter (Signed)
 FYI Only or Action Required?: FYI only for provider.  Patient was last seen in primary care on 11/03/2023 by Frann Mabel Mt, DO.  Called Nurse Triage reporting [place].  Symptoms began today.  Interventions attempted: Nothing.  Symptoms are: stable.  Triage Disposition: See Physician Within 24 Hours  Patient/caregiver understands and will follow disposition?: No, refuses disposition Reason for Disposition  [1] Swelling is painful to touch AND [2] no fever  Answer Assessment - Initial Assessment Questions States can feel something over there on right side when swallowing. Advised patient to be seen today at first available at different [place] office, patient stated he would like to monitor over the weekend, scheduled first available in PCP office on Monday. Advised to monitor and if sees any changes go to UC for further evaluation.   1. SIZE: How large is the swelling? (e.g., inches, cm; or compare to size of pinhead, tip of pen, eraser, coin, pea, grape, ping pong ball)      Bigger than a half dollar  2. LOCATION: Where is the swelling located?     Right side of neck  3. ONSET: When did the swelling start?     Noticed today  4. COLOR: What color is it? Is there more than one color?     Skin color  5. PAIN: Is there any pain? If Yes, ask: How bad is the pain? (Scale 1-10; or mild, moderate, severe)       Mild when pushing  6. ITCH: Does it itch? If Yes, ask: How bad is the itch?      No  7 OTHER SYMPTOMS: Do you have any other symptoms? (e.g., fever)     No  Protocols used: Skin Lump or Localized Swelling-A-AH  Copied from CRM #[id number]. Topic: Clinical - Red Word Triage >> Jul 28, 2024  1:57 PM Aisha D wrote: Red Word that prompted transfer to Nurse Triage: pain   Pt's has a lump on his neck and is experiencing pain when he touches it. Pt would like to schedule an appt with provider.

## 2024-07-28 NOTE — Telephone Encounter (Signed)
 Appt scheduled

## 2024-07-31 ENCOUNTER — Ambulatory Visit: Admitting: Family Medicine

## 2024-07-31 ENCOUNTER — Ambulatory Visit (INDEPENDENT_AMBULATORY_CARE_PROVIDER_SITE_OTHER): Admitting: Physician Assistant

## 2024-07-31 ENCOUNTER — Encounter: Payer: Self-pay | Admitting: Physician Assistant

## 2024-07-31 DIAGNOSIS — F411 Generalized anxiety disorder: Secondary | ICD-10-CM

## 2024-07-31 DIAGNOSIS — F17200 Nicotine dependence, unspecified, uncomplicated: Secondary | ICD-10-CM

## 2024-07-31 DIAGNOSIS — F4010 Social phobia, unspecified: Secondary | ICD-10-CM

## 2024-07-31 DIAGNOSIS — F259 Schizoaffective disorder, unspecified: Secondary | ICD-10-CM | POA: Diagnosis not present

## 2024-07-31 MED ORDER — PAROXETINE HCL 10 MG PO TABS: 10.0000 mg | ORAL_TABLET | Freq: Every day | ORAL | 5 refills | Status: AC

## 2024-07-31 MED ORDER — PAROXETINE HCL 20 MG PO TABS: 20.0000 mg | ORAL_TABLET | Freq: Every day | ORAL | 5 refills | Status: AC

## 2024-07-31 MED ORDER — GABAPENTIN 600 MG PO TABS: 1200.0000 mg | ORAL_TABLET | Freq: Three times a day (TID) | ORAL | 5 refills | Status: AC

## 2024-07-31 MED ORDER — RISPERIDONE 4 MG PO TABS: ORAL_TABLET | ORAL | 5 refills | Status: AC

## 2024-07-31 MED ORDER — QUETIAPINE FUMARATE 400 MG PO TABS: ORAL_TABLET | ORAL | 5 refills | Status: AC

## 2024-07-31 MED ORDER — RISPERIDONE 1 MG PO TABS: 1.0000 mg | ORAL_TABLET | Freq: Every day | ORAL | 5 refills | Status: AC

## 2024-07-31 MED ORDER — ALPRAZOLAM 1 MG PO TABS: 1.0000 mg | ORAL_TABLET | Freq: Three times a day (TID) | ORAL | 5 refills | Status: AC | PRN

## 2024-07-31 NOTE — Progress Notes (Signed)
 [facility] Med Check  Patient ID: [name],  MRN: [id number]  PCP: Frann Mabel Mt, DO  Date of Evaluation: 07/31/2024 Time spent:25 minutes  Chief Complaint:  Chief Complaint   Follow-up    HISTORY/CURRENT STATUS: HPI  For routine med check. Mom, Reginald Dunn is with him.   Reginald Dunn is doing well on current meds. Patient is able to enjoy things. Likes watching sports.  Energy and motivation are good.  No extreme sadness, tearfulness, or feelings of hopelessness.  Sleeps well, Seroquel  and Risperdal  help him go to sleep. ADLs and personal hygiene are normal.   Denies any changes in concentration, making decisions, or remembering things. Anxiety is well controlled.  No SI/HI.  No reports of increased energy with decreased need for sleep, increased talkativeness, racing thoughts, impulsivity or risky behaviors, increased spending, grandiosity, increased irritability or anger, paranoia, or hallucinations.  Individual Medical History/ Review of Systems: Changes? :No       Past medications for mental health diagnoses include: Pristiq, Celexa, lithium, Depakote, carbamazepine, Ativan, Ambien, gabapentin , Ritalin, Vyvanse, Zoloft, Wellbutrin, Latuda, Seroquel , Zyprexa, Risperdal , Lamictal, Vistaril, BuSpar, clozapine, Klonopin, Paxil   Allergies: Patient has no known allergies.  Current Medications:  Current Outpatient Medications:    albuterol  (VENTOLIN  HFA) 108 (90 Base) MCG/ACT inhaler, Inhale 1 puff into the lungs every 6 (six) hours as needed for wheezing or shortness of breath., Disp: 18 g, Rfl: 0   omeprazole (PRILOSEC) 20 MG capsule, Take 20 mg by mouth daily., Disp: , Rfl:    Vitamin D , Ergocalciferol , (DRISDOL ) 1.25 MG (50000 UNIT) CAPS capsule, TAKE 1 CAPSULE BY MOUTH EVERY 7 DAYS, Disp: 12 capsule, Rfl: 0   ALPRAZolam  (XANAX ) 1 MG tablet, Take 1 tablet (1 mg total) by mouth 3 (three) times daily as needed for anxiety., Disp: 90 tablet, Rfl: 5   gabapentin  (NEURONTIN )  600 MG tablet, Take 2 tablets (1,200 mg total) by mouth 3 (three) times daily., Disp: 180 tablet, Rfl: 5   PARoxetine  (PAXIL ) 10 MG tablet, Take 1 tablet (10 mg total) by mouth daily., Disp: 30 tablet, Rfl: 5   PARoxetine  (PAXIL ) 20 MG tablet, Take 1 tablet (20 mg total) by mouth daily., Disp: 30 tablet, Rfl: 5   QUEtiapine  (SEROQUEL ) 400 MG tablet, TAKE 2 AND 1/2 TABLETS BY MOUTH EVERY NIGHT AT BEDTIME, Disp: 75 tablet, Rfl: 5   risperiDONE  (RISPERDAL ) 1 MG tablet, Take 1 tablet (1 mg total) by mouth at bedtime. With 4 mg=5 mg qhs, Disp: 30 tablet, Rfl: 5   risperidone  (RISPERDAL ) 4 MG tablet, 1 qhs w/ 1mg  =5 mg, Disp: 30 tablet, Rfl: 5 Medication Side Effects: none  Family Medical/ Social History: Changes?  New dog, 48 month old named Engineer, agricultural.  MENTAL HEALTH EXAM:  There were no vitals taken for this visit.There is no height or weight on file to calculate BMI.  General Appearance: Casual and Well Groomed  Eye Contact:  Good  Speech:  Clear and Coherent and Normal Rate  Volume:  Normal  Mood:  Euthymic  Affect:  Congruent  Thought Process:  Goal Directed and Descriptions of Associations: Circumstantial  Orientation:  Full (Time, Place, and Person)  Thought Content: Logical   Suicidal Thoughts:  No  Homicidal Thoughts:  No  Memory:  WNL  Judgement:  Good  Insight:  Good  Psychomotor Activity:  Normal  Concentration:  Concentration: Good and Attention Span: Good  Recall:  Good  Fund of Knowledge: Good  Language: Good  Assets:  Desire for Improvement Financial Resources/Insurance  Housing Resilience Social Support Transportation  ADL's:  Intact  Cognition: WNL  Prognosis:  Good   PCP and Nephrology follow labs.   DIAGNOSES:    ICD-10-CM   1. Schizoaffective disorder, unspecified type (HCC)  F25.9 QUEtiapine  (SEROQUEL ) 400 MG tablet    2. Generalized anxiety disorder  F41.1 ALPRAZolam  (XANAX ) 1 MG tablet    gabapentin  (NEURONTIN ) 600 MG tablet    PARoxetine  (PAXIL ) 20 MG  tablet    QUEtiapine  (SEROQUEL ) 400 MG tablet    3. Smoker  F17.200     4. Social anxiety disorder  F40.10      Receiving Psychotherapy: No   RECOMMENDATIONS:  PDMP was reviewed.  Last Xanax  filled 07/06/2024.  Gabapentin  filled 07/06/2024. I provided approximately 25 minutes of face to face time during this encounter, including time spent before and after the visit in records review, medical decision making, counseling pertinent to today's visit, and charting.   Smoking cessation briefly discussed.  He states he is not ready to quit.  Continue Xanax  1 mg, 1 p.o. 3 times daily as needed anxiety. Continue gabapentin  600 mg, 2 p.o. every morning, q. afternoon, and nightly. Ok to skip afternoon dose.  Continue Paxil  30 mg daily.   (10 mg +20 mg) Continue Seroquel  400 mg, 2.5 pills nightly. Continue Risperdal  5 mg qhs. ( 4 mg +1 mg pills.) Return in 6 months.  Reginald Cooks, PA-C

## 2024-08-21 ENCOUNTER — Telehealth: Payer: Self-pay | Admitting: Gastroenterology

## 2024-08-21 NOTE — Telephone Encounter (Signed)
 Returned call to patient's mother, Karna. Karna reports that these are the same issues that patient has been having for years but they seem to be getting worse. Offered an appt later this week with an APP but [facility] declined. Karna stated that patient preferred to see Dr. San. Patient will be scheduled in a 7-day hold spot on 09/12/24 at 10:20 am once appt is released. Karna verbalized understanding and had no concerns at the end of the call.

## 2024-08-21 NOTE — Telephone Encounter (Signed)
 Inbound call from patient's mother stating patient is having nausea and vomiting. Does not wish to wait until December to be seen with Dr. San. Patient's mother requesting a call back. Please advise, thank you

## 2024-08-24 ENCOUNTER — Ambulatory Visit: Admitting: Gastroenterology

## 2024-09-12 ENCOUNTER — Encounter: Payer: Self-pay | Admitting: Gastroenterology

## 2024-09-12 ENCOUNTER — Ambulatory Visit (INDEPENDENT_AMBULATORY_CARE_PROVIDER_SITE_OTHER): Admitting: Gastroenterology

## 2024-09-12 VITALS — BP 130/80 | HR 80 | Ht 70.0 in | Wt 179.0 lb

## 2024-09-12 DIAGNOSIS — K219 Gastro-esophageal reflux disease without esophagitis: Secondary | ICD-10-CM

## 2024-09-12 DIAGNOSIS — K449 Diaphragmatic hernia without obstruction or gangrene: Secondary | ICD-10-CM | POA: Diagnosis not present

## 2024-09-12 DIAGNOSIS — R1115 Cyclical vomiting syndrome unrelated to migraine: Secondary | ICD-10-CM | POA: Diagnosis not present

## 2024-09-12 MED ORDER — SUMATRIPTAN 20 MG/ACT NA SOLN: 20.0000 mg | NASAL | 1 refills | Status: AC | PRN

## 2024-09-12 MED ORDER — PANTOPRAZOLE SODIUM 40 MG PO TBEC: 40.0000 mg | DELAYED_RELEASE_TABLET | Freq: Two times a day (BID) | ORAL | 3 refills | Status: AC

## 2024-09-12 NOTE — Progress Notes (Signed)
 Chief Complaint: Nausea/vomiting, GERD  GI history: 43 year-old male initially seen by me in 02/2019 with intermittent nausea/vomiting and 30# unintentional weight loss over 1 year.  Had been treated with Zofran , Reglan , and Phenergan .  Evaluated in the ER in 11/2018.  Mild leukocytosis (11.3) otherwise normal CBC and CMP and lipase.  CT was normal (small hiatal hernia).  Discharged with Reglan . Sxs last 48-72 hours, then resolve.  Asymptomatic in between episodes.  Symptoms recurred in 01/2019 and again in 02/2019, lasting 3 days each.   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 EGD n/f HH per mother and lap HH repair with fundoplication (unsure if complete or partial) completed. Reflux sxs resolved and able to stop all antacids.  Of note, was unable to vomit postop until 2020.   Additionally, history of Generalized Anxiety Disorder and Schizoaffective disorder, currently treated with Xanax  prn, Neurontin  (n/v 8%), Paxil  (nausea 17-26%, vomiting 2-3%), Seroquel  (nausea 5-10%, vomiting 3-8%), Risperdal  (nausea 3-16%, vomiting <4%). None of these medications were new when sxs started in 2020.     Endoscopic History: -EGD (04/20/2019, Dr. San): 2 tongues of salmon-colored mucosa, 1 cm (biopsies benign), loose Nissen fundoplication with small hiatal hernia, small amount of food residue in gastric body, severely erythematous and edematous mucosa in gastric cardia (biopsies negative for H. pylori), with otherwise normal stomach and duodenum.  Recommended GES.  - CT A/P (11/2018): Small hiatal hernia, otherwise normal GI tract - GES (05/2019): Mildly delayed emptying, with 66% and 73% emptying at 3 and 4 hours - CT head (05/2019): Normal - 04/2019: Prescribed empiric trial of Imitrex  for possible CVS - 06/2019: Follow-up in the GI clinic.  No recurrence of nausea/vomiting.  Reflux well-controlled    HPI:      Reginald Dunn is a 43 y.o. male referred to the Gastroenterology Clinic for evaluation of nausea and vomiting.  Presents to the office with his father.  Was previously evaluated by me for similar symptoms in 2020 with an extensive evaluation as outlined above, which was largely unrevealing to include EGD, GES, CT head, CT abdomen/pelvis.  Symptoms had resolved by follow-up in 06/2019.  Per patient, symptoms started to recur a few months later, but only seeking follow-up now.  Will have nausea with nonbloody emesis approximately 3-4 nights/week.  Tends to occur around 7 PM (after dinner). Feels better after emesis. Can have no sxs for 3-4 weeks, then recurs. Seems to be more frequent now.   Also with heartburn during these episodes. Now using OTC Prilosec 20 mg daily along with Mylanta and Tums for breakthrough.  Drinks 80 oz of water/day.   Was recently seen in the [facility]iatry [facility] for routine follow-up on 9/15/725.  Medications all stable and unchanged for at least the last 5 years as above.   Past Medical History:  Diagnosis Date   ADHD    Allergy    seasonal   Blood transfusion without reported diagnosis    2017   Depression    GERD (gastroesophageal reflux disease)    Social anxiety disorder      Past Surgical History:  Procedure Laterality Date   HERNIA REPAIR  2015   HIATAL HERNIA REPAIR  08/2016   Family History  Problem Relation Age of Onset   Arthritis Mother    Hearing loss Mother    Colon cancer Neg  Hx    Colon polyps Neg Hx    Esophageal cancer Neg Hx    Pancreatic cancer Neg Hx    Prostate cancer Neg Hx    Rectal cancer Neg Hx    Social History   Tobacco Use   Smoking status: Every Day    Current packs/day: 0.50    Average packs/day: 0.5 packs/day for 20.0 years (10.0 ttl pk-yrs)    Types: Cigarettes   Smokeless tobacco: Never  Vaping Use   Vaping status: Never Used  Substance Use Topics   Alcohol use: Not Currently    Alcohol/week: 1.0  standard drink of alcohol    Types: 1 Cans of beer per week    Comment: socially q3 months   Drug use: Never   Current Outpatient Medications  Medication Sig Dispense Refill   albuterol  (VENTOLIN  HFA) 108 (90 Base) MCG/ACT inhaler Inhale 1 puff into the lungs every 6 (six) hours as needed for wheezing or shortness of breath. 18 g 0   ALPRAZolam  (XANAX ) 1 MG tablet Take 1 tablet (1 mg total) by mouth 3 (three) times daily as needed for anxiety. 90 tablet 5   gabapentin  (NEURONTIN ) 600 MG tablet Take 2 tablets (1,200 mg total) by mouth 3 (three) times daily. 180 tablet 5   omeprazole (PRILOSEC) 20 MG capsule Take 20 mg by mouth daily.     PARoxetine  (PAXIL ) 10 MG tablet Take 1 tablet (10 mg total) by mouth daily. 30 tablet 5   PARoxetine  (PAXIL ) 20 MG tablet Take 1 tablet (20 mg total) by mouth daily. 30 tablet 5   QUEtiapine  (SEROQUEL ) 400 MG tablet TAKE 2 AND 1/2 TABLETS BY MOUTH EVERY NIGHT AT BEDTIME 75 tablet 5   risperiDONE  (RISPERDAL ) 1 MG tablet Take 1 tablet (1 mg total) by mouth at bedtime. With 4 mg=5 mg qhs 30 tablet 5   risperidone  (RISPERDAL ) 4 MG tablet 1 qhs w/ 1mg  =5 mg 30 tablet 5   Vitamin D , Ergocalciferol , (DRISDOL ) 1.25 MG (50000 UNIT) CAPS capsule TAKE 1 CAPSULE BY MOUTH EVERY 7 DAYS 12 capsule 0   No current facility-administered medications for this visit.   No Known Allergies   Review of Systems: All systems reviewed and negative except where noted in HPI.     Physical Exam:    Wt Readings from Last 3 Encounters:  09/12/24 179 lb (81.2 kg)  07/11/24 179 lb (81.2 kg)  11/03/23 179 lb 12.8 oz (81.6 kg)    Pulse 80   Ht 5' 10 (1.778 m)   Wt 179 lb (81.2 kg)   BMI 25.68 kg/m  Constitutional:  Pleasant, in no acute distress. Neurological: Alert and oriented to person place and time. Skin: Skin is warm and dry. No rashes noted.   ASSESSMENT AND PLAN;   1) GERD 2) Hiatal hernia Increasing reflux symptoms despite Prilosec 20 mg daily.  Does have a  history of hiatal hernia pair and Nissen fundoplication 2017, but repeat EGD in 2020 shows loose wrap with recurrence of at least small hiatal hernia.  With increasing reflux symptoms, may have had worsening of hiatal hernia with continued loosening of wrap.  Will treat as follows:  - Start Protonix  40 mg p.o. twice daily x 4 weeks.  If symptoms well-controlled, can reduce to 40 mg daily.  If breakthrough symptoms at daily dosing, will plan to resume high-dose therapy - Depending on response to high-dose PPI, may need to consider repeat EGD to reevaluate wrap and size/grade of hiatal hernia -  Avoid eating within 3 hours of bedtime  3) Nausea/Vomiting Clinical presentation seems most consistent with Cyclic Vomiting Syndrome.  Did have mildly delayed emptying on prior GES in 05/2019 as well.  Discussed diagnosis along with full DDx at length today with plan for the following: - Trial Imitrex  20 mg intranasal on-demand - Evaluate for overall improvement with better control of reflux as above - I asked him to send me a MyChart message in the next couple of weeks to let me know how he is doing and how he responds to the Imitrex  - If no change, may need to consider repeat GES - Previously discussed possibility of medication ADR given multiple medications prescribed that have nausea/vomiting listed as ADR as outlined above.  Each of these medications have been prescribed for years and at steady dose  I spent 35 minutes of time, including in depth chart review, independent review of results as outlined above, communicating results with the patient directly, face-to-face time with the patient, coordinating care, and ordering studies and medications as appropriate, and documentation.    Sandor LULLA Flatter, DO, FACG  09/12/2024, 10:13 AM   Frann, Mabel Mt*

## 2024-09-12 NOTE — Patient Instructions (Addendum)
 _______________________________________________________  If your blood pressure at your visit was 140/90 or greater, please contact your primary care physician to follow up on this.  _______________________________________________________  If you are age 44 or older, your body mass index should be between 23-30. Your Body mass index is 25.68 kg/m. If this is out of the aforementioned range listed, please consider follow up with your Primary Care Provider.  If you are age 65 or younger, your body mass index should be between 19-25. Your Body mass index is 25.68 kg/m. If this is out of the aformentioned range listed, please consider follow up with your Primary Care Provider.   ________________________________________________________  The [facility] providers would like to encourage you to use MYCHART to communicate with providers for non-urgent requests or questions.  Due to long hold times on the telephone, sending your provider a message by [facility] may be a faster and more efficient way to get a response.  Please allow 48 business hours for a response.  Please remember that this is for non-urgent requests.  _______________________________________________________  [facility] is using a team-based approach to care.  Your team is made up of your doctor and two to three APPS. Our APPS (Nurse Practitioners and Physician Assistants) work with your physician to ensure care continuity for you. They are fully qualified to address your health concerns and develop a treatment plan. They communicate directly with your gastroenterologist to care for you. Seeing the Advanced Practice Practitioners on your physician's team can help you by facilitating care more promptly, often allowing for earlier appointments, access to diagnostic testing, procedures, and other specialty referrals.   We have sent the following medications to your pharmacy for you to pick up at your convenience:  DISCONTINUE:  omeprazole  START: Protonix  40mg  one tablet two times daily  START: Imitrex  20mg  intranasal as needed for nausea and vomiting.  It was a pleasure to see you today!  Vito Cirigliano, D.O.

## 2024-09-29 ENCOUNTER — Other Ambulatory Visit: Payer: Self-pay | Admitting: Family Medicine

## 2024-10-06 ENCOUNTER — Encounter: Payer: Self-pay | Admitting: Gastroenterology

## 2024-10-09 ENCOUNTER — Telehealth: Payer: Self-pay | Admitting: Gastroenterology

## 2024-10-09 DIAGNOSIS — R1115 Cyclical vomiting syndrome unrelated to migraine: Secondary | ICD-10-CM

## 2024-10-09 DIAGNOSIS — K449 Diaphragmatic hernia without obstruction or gangrene: Secondary | ICD-10-CM

## 2024-10-09 DIAGNOSIS — K219 Gastro-esophageal reflux disease without esophagitis: Secondary | ICD-10-CM

## 2024-10-09 NOTE — Telephone Encounter (Signed)
 Patient mother calling in regards to MyChart message. Please advise.

## 2024-10-10 ENCOUNTER — Telehealth: Payer: Self-pay

## 2024-10-10 NOTE — Telephone Encounter (Signed)
 Appt with Dr. Tanda 11-22-24

## 2024-10-10 NOTE — Telephone Encounter (Signed)
 Called & spoke with patient's mother Karna. Karna wanted to follow up on 11/21 MyChart message and proceed with EGD and [facility] referral for patient. Patient has been scheduled for EGD on Friday, 11/17/24 arriving at 9 am. Karna is aware that I will send instructions via MyChart. Karna knows to expect a call from [facility] to schedule consultation (Dr. Tanda or Dr. Signe) to discuss possible [facility] repair and redo fundoplication  EGD instructions sent via MyChart message.  Ambulatory referral to GI in epic.   Secure staff message sent to [facility] Referral pool and Beth R for referral request.

## 2024-10-30 ENCOUNTER — Other Ambulatory Visit: Payer: Self-pay | Admitting: Physician Assistant

## 2024-10-30 DIAGNOSIS — F411 Generalized anxiety disorder: Secondary | ICD-10-CM

## 2024-10-30 DIAGNOSIS — F259 Schizoaffective disorder, unspecified: Secondary | ICD-10-CM

## 2024-11-01 ENCOUNTER — Other Ambulatory Visit: Payer: Self-pay

## 2024-11-01 ENCOUNTER — Encounter: Payer: Self-pay | Admitting: Family Medicine

## 2024-11-01 ENCOUNTER — Ambulatory Visit: Admitting: Family Medicine

## 2024-11-01 ENCOUNTER — Ambulatory Visit: Payer: Self-pay | Admitting: Family Medicine

## 2024-11-01 VITALS — BP 128/82 | HR 100 | Temp 98.0°F | Resp 16 | Ht 70.0 in | Wt 186.6 lb

## 2024-11-01 DIAGNOSIS — E559 Vitamin D deficiency, unspecified: Secondary | ICD-10-CM | POA: Diagnosis not present

## 2024-11-01 DIAGNOSIS — G8929 Other chronic pain: Secondary | ICD-10-CM | POA: Diagnosis not present

## 2024-11-01 DIAGNOSIS — M545 Low back pain, unspecified: Secondary | ICD-10-CM | POA: Diagnosis not present

## 2024-11-01 DIAGNOSIS — Z1159 Encounter for screening for other viral diseases: Secondary | ICD-10-CM | POA: Diagnosis not present

## 2024-11-01 DIAGNOSIS — E7800 Pure hypercholesterolemia, unspecified: Secondary | ICD-10-CM | POA: Insufficient documentation

## 2024-11-01 LAB — COMPREHENSIVE METABOLIC PANEL WITH GFR
ALT: 16 U/L (ref 3–53)
AST: 14 U/L (ref 5–37)
Albumin: 4.3 g/dL (ref 3.5–5.2)
Alkaline Phosphatase: 95 U/L (ref 39–117)
BUN: 15 mg/dL (ref 6–23)
CO2: 26 meq/L (ref 19–32)
Calcium: 10.2 mg/dL (ref 8.4–10.5)
Chloride: 102 meq/L (ref 96–112)
Creatinine, Ser: 1.64 mg/dL — ABNORMAL HIGH (ref 0.40–1.50)
GFR: 50.59 mL/min — ABNORMAL LOW (ref >60.00)
Glucose, Bld: 90 mg/dL (ref 70–99)
Potassium: 4 meq/L (ref 3.5–5.1)
Sodium: 135 meq/L (ref 135–145)
Total Bilirubin: 0.4 mg/dL (ref 0.2–1.2)
Total Protein: 6.6 g/dL (ref 6.0–8.3)

## 2024-11-01 LAB — LIPID PANEL
Cholesterol: 210 mg/dL — ABNORMAL HIGH (ref 28–200)
HDL: 68.4 mg/dL (ref >39.00)
LDL Cholesterol: 119 mg/dL — ABNORMAL HIGH (ref 10–99)
NonHDL: 141.56
Total CHOL/HDL Ratio: 3
Triglycerides: 114 mg/dL (ref 10.0–149.0)
VLDL: 22.8 mg/dL (ref 0.0–40.0)

## 2024-11-01 LAB — VITAMIN D 25 HYDROXY (VIT D DEFICIENCY, FRACTURES): VITD: 27.79 ng/mL — ABNORMAL LOW (ref 30.00–100.00)
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
## 2024-11-01 NOTE — Patient Instructions (Addendum)
 Give us  2-3 business days to get the results of your labs back.   Keep the diet clean and stay active.  Aim to do some physical exertion for 150 minutes per week. This is typically divided into 5 days per week, 30 minutes per day. The activity should be enough to get your heart rate up. Anything is better than nothing if you have time constraints.  Heat (pad or rice pillow in microwave) over affected area, 10-15 minutes twice daily.   Ice/cold pack over area for 10-15 min twice daily.  OK to take Tylenol 1000 mg (2 extra strength tabs) or 975 mg (3 regular strength tabs) every 6 hours as needed.  EXERCISES  RANGE OF MOTION (ROM) AND STRETCHING EXERCISES - Low Back Pain Most people with lower back pain will find that their symptoms get worse with excessive bending forward (flexion) or arching at the lower back (extension). The exercises that will help resolve your symptoms will focus on the opposite motion.  If you have pain, numbness or tingling which travels down into your buttocks, leg or foot, the goal of the therapy is for these symptoms to move closer to your back and eventually resolve. Sometimes, these leg symptoms will get better, but your lower back pain may worsen. This is often an indication of progress in your rehabilitation. Be very alert to any changes in your symptoms and the activities in which you participated in the 24 hours prior to the change. Sharing this information with your caregiver will allow him or her to most efficiently treat your condition. These exercises may help you when beginning to rehabilitate your injury. Your symptoms may resolve with or without further involvement from your physician, physical therapist or athletic trainer. While completing these exercises, remember:  Restoring tissue flexibility helps normal motion to return to the joints. This allows healthier, less painful movement and activity. An effective stretch should be held for at least 30  seconds. A stretch should never be painful. You should only feel a gentle lengthening or release in the stretched tissue. FLEXION RANGE OF MOTION AND STRETCHING EXERCISES:  STRETCH - Flexion, Single Knee to Chest  Lie on a firm bed or floor with both legs extended in front of you. Keeping one leg in contact with the floor, bring your opposite knee to your chest. Hold your leg in place by either grabbing behind your thigh or at your knee. Pull until you feel a gentle stretch in your low back. Hold 30 seconds. Slowly release your grasp and repeat the exercise with the opposite side. Repeat 2 times. Complete this exercise 3 times per week.   STRETCH - Flexion, Double Knee to Chest Lie on a firm bed or floor with both legs extended in front of you. Keeping one leg in contact with the floor, bring your opposite knee to your chest. Tense your stomach muscles to support your back and then lift your other knee to your chest. Hold your legs in place by either grabbing behind your thighs or at your knees. Pull both knees toward your chest until you feel a gentle stretch in your low back. Hold 30 seconds. Tense your stomach muscles and slowly return one leg at a time to the floor. Repeat 2 times. Complete this exercise 3 times per week.   STRETCH - Low Trunk Rotation Lie on a firm bed or floor. Keeping your legs in front of you, bend your knees so they are both pointed toward the ceiling and your  feet are flat on the floor. Extend your arms out to the side. This will stabilize your upper body by keeping your shoulders in contact with the floor. Gently and slowly drop both knees together to one side until you feel a gentle stretch in your low back. Hold for 30 seconds. Tense your stomach muscles to support your lower back as you bring your knees back to the starting position. Repeat the exercise to the other side. Repeat 2 times. Complete this exercise at least 3 times per week.   EXTENSION RANGE OF  MOTION AND FLEXIBILITY EXERCISES:  STRETCH - Extension, Prone on Elbows  Lie on your stomach on the floor, a bed will be too soft. Place your palms about shoulder width apart and at the height of your head. Place your elbows under your shoulders. If this is too painful, stack pillows under your chest. Allow your body to relax so that your hips drop lower and make contact more completely with the floor. Hold this position for 30 seconds. Slowly return to lying flat on the floor. Repeat 2 times. Complete this exercise 3 times per week.   RANGE OF MOTION - Extension, Prone Press Ups Lie on your stomach on the floor, a bed will be too soft. Place your palms about shoulder width apart and at the height of your head. Keeping your back as relaxed as possible, slowly straighten your elbows while keeping your hips on the floor. You may adjust the placement of your hands to maximize your comfort. As you gain motion, your hands will come more underneath your shoulders. Hold this position 30 seconds. Slowly return to lying flat on the floor. Repeat 2 times. Complete this exercise 3 times per week.   RANGE OF MOTION- Quadruped, Neutral Spine  Assume a hands and knees position on a firm surface. Keep your hands under your shoulders and your knees under your hips. You may place padding under your knees for comfort. Drop your head and point your tailbone toward the ground below you. This will round out your lower back like an angry cat. Hold this position for 30 seconds. Slowly lift your head and release your tail bone so that your back sags into a large arch, like an old horse. Hold this position for 30 seconds. Repeat this until you feel limber in your low back. Now, find your sweet spot. This will be the most comfortable position somewhere between the two previous positions. This is your neutral spine. Once you have found this position, tense your stomach muscles to support your low back. Hold this  position for 30 seconds. Repeat 2 times. Complete this exercise 3 times per week.   STRENGTHENING EXERCISES - Low Back Sprain These exercises may help you when beginning to rehabilitate your injury. These exercises should be done near your sweet spot. This is the neutral, low-back arch, somewhere between fully rounded and fully arched, that is your least painful position. When performed in this safe range of motion, these exercises can be used for people who have either a flexion or extension based injury. These exercises may resolve your symptoms with or without further involvement from your physician, physical therapist or athletic trainer. While completing these exercises, remember:  Muscles can gain both the endurance and the strength needed for everyday activities through controlled exercises. Complete these exercises as instructed by your physician, physical therapist or athletic trainer. Increase the resistance and repetitions only as guided. You may experience muscle soreness or fatigue, but the  pain or discomfort you are trying to eliminate should never worsen during these exercises. If this pain does worsen, stop and make certain you are following the directions exactly. If the pain is still present after adjustments, discontinue the exercise until you can discuss the trouble with your caregiver.  STRENGTHENING - Deep Abdominals, Pelvic Tilt  Lie on a firm bed or floor. Keeping your legs in front of you, bend your knees so they are both pointed toward the ceiling and your feet are flat on the floor. Tense your lower abdominal muscles to press your low back into the floor. This motion will rotate your pelvis so that your tail bone is scooping upwards rather than pointing at your feet or into the floor. With a gentle tension and even breathing, hold this position for 3 seconds. Repeat 2 times. Complete this exercise 3 times per week.   STRENGTHENING - Abdominals, Crunches  Lie on a firm bed  or floor. Keeping your legs in front of you, bend your knees so they are both pointed toward the ceiling and your feet are flat on the floor. Cross your arms over your chest. Slightly tip your chin down without bending your neck. Tense your abdominals and slowly lift your trunk high enough to just clear your shoulder blades. Lifting higher can put excessive stress on the lower back and does not further strengthen your abdominal muscles. Control your return to the starting position. Repeat 2 times. Complete this exercise 3 times per week.   STRENGTHENING - Quadruped, Opposite UE/LE Lift  Assume a hands and knees position on a firm surface. Keep your hands under your shoulders and your knees under your hips. You may place padding under your knees for comfort. Find your neutral spine and gently tense your abdominal muscles so that you can maintain this position. Your shoulders and hips should form a rectangle that is parallel with the floor and is not twisted. Keeping your trunk steady, lift your right hand no higher than your shoulder and then your left leg no higher than your hip. Make sure you are not holding your breath. Hold this position for 30 seconds. Continuing to keep your abdominal muscles tense and your back steady, slowly return to your starting position. Repeat with the opposite arm and leg. Repeat 2 times. Complete this exercise 3 times per week.   STRENGTHENING - Abdominals and Quadriceps, Straight Leg Raise  Lie on a firm bed or floor with both legs extended in front of you. Keeping one leg in contact with the floor, bend the other knee so that your foot can rest flat on the floor. Find your neutral spine, and tense your abdominal muscles to maintain your spinal position throughout the exercise. Slowly lift your straight leg off the floor about 6 inches for a count of 3, making sure to not hold your breath. Still keeping your neutral spine, slowly lower your leg all the way to the  floor. Repeat this exercise with each leg 2 times. Complete this exercise 3 times per week.  POSTURE AND BODY MECHANICS CONSIDERATIONS - Low Back Sprain Keeping correct posture when sitting, standing or completing your activities will reduce the stress put on different body tissues, allowing injured tissues a chance to heal and limiting painful experiences. The following are general guidelines for improved posture.  While reading these guidelines, remember: The exercises prescribed by your provider will help you have the flexibility and strength to maintain correct postures. The correct posture provides the best  environment for your joints to work. All of your joints have less wear and tear when properly supported by a spine with good posture. This means you will experience a healthier, less painful body. Correct posture must be practiced with all of your activities, especially prolonged sitting and standing. Correct posture is as important when doing repetitive low-stress activities (typing) as it is when doing a single heavy-load activity (lifting).  RESTING POSITIONS Consider which positions are most painful for you when choosing a resting position. If you have pain with flexion-based activities (sitting, bending, stooping, squatting), choose a position that allows you to rest in a less flexed posture. You would want to avoid curling into a fetal position on your side. If your pain worsens with extension-based activities (prolonged standing, working overhead), avoid resting in an extended position such as sleeping on your stomach. Most people will find more comfort when they rest with their spine in a more neutral position, neither too rounded nor too arched. Lying on a non-sagging bed on your side with a pillow between your knees, or on your back with a pillow under your knees will often provide some relief. Keep in mind, being in any one position for a prolonged period of time, no matter how correct  your posture, can still lead to stiffness.  PROPER SITTING POSTURE In order to minimize stress and discomfort on your spine, you must sit with correct posture. Sitting with good posture should be effortless for a healthy body. Returning to good posture is a gradual process. Many people can work toward this most comfortably by using various supports until they have the flexibility and strength to maintain this posture on their own. When sitting with proper posture, your ears will fall over your shoulders and your shoulders will fall over your hips. You should use the back of the chair to support your upper back. Your lower back will be in a neutral position, just slightly arched. You may place a small pillow or folded towel at the base of your lower back for  support.  When working at a desk, create an environment that supports good, upright posture. Without extra support, muscles tire, which leads to excessive strain on joints and other tissues. Keep these recommendations in mind:  CHAIR: A chair should be able to slide under your desk when your back makes contact with the back of the chair. This allows you to work closely. The chair's height should allow your eyes to be level with the upper part of your monitor and your hands to be slightly lower than your elbows.  BODY POSITION Your feet should make contact with the floor. If this is not possible, use a foot rest. Keep your ears over your shoulders. This will reduce stress on your neck and low back.  INCORRECT SITTING POSTURES  If you are feeling tired and unable to assume a healthy sitting posture, do not slouch or slump. This puts excessive strain on your back tissues, causing more damage and pain. Healthier options include: Using more support, like a lumbar pillow. Switching tasks to something that requires you to be upright or walking. Talking a brief walk. Lying down to rest in a neutral-spine position.  PROLONGED STANDING WHILE  SLIGHTLY LEANING FORWARD  When completing a task that requires you to lean forward while standing in one place for a long time, place either foot up on a stationary 2-4 inch high object to help maintain the best posture. When both feet are on the  ground, the lower back tends to lose its slight inward curve. If this curve flattens (or becomes too large), then the back and your other joints will experience too much stress, tire more quickly, and can cause pain.  CORRECT STANDING POSTURES Proper standing posture should be assumed with all daily activities, even if they only take a few moments, like when brushing your teeth. As in sitting, your ears should fall over your shoulders and your shoulders should fall over your hips. You should keep a slight tension in your abdominal muscles to brace your spine. Your tailbone should point down to the ground, not behind your body, resulting in an over-extended swayback posture.   INCORRECT STANDING POSTURES  Common incorrect standing postures include a forward head, locked knees and/or an excessive swayback. WALKING Walk with an upright posture. Your ears, shoulders and hips should all line-up.  PROLONGED ACTIVITY IN A FLEXED POSITION When completing a task that requires you to bend forward at your waist or lean over a low surface, try to find a way to stabilize 3 out of 4 of your limbs. You can place a hand or elbow on your thigh or rest a knee on the surface you are reaching across. This will provide you more stability, so that your muscles do not tire as quickly. By keeping your knees relaxed, or slightly bent, you will also reduce stress across your lower back. CORRECT LIFTING TECHNIQUES  DO : Assume a wide stance. This will provide you more stability and the opportunity to get as close as possible to the object which you are lifting. Tense your abdominals to brace your spine. Bend at the knees and hips. Keeping your back locked in a neutral-spine position,  lift using your leg muscles. Lift with your legs, keeping your back straight. Test the weight of unknown objects before attempting to lift them. Try to keep your elbows locked down at your sides in order get the best strength from your shoulders when carrying an object.   Always ask for help when lifting heavy or awkward objects. INCORRECT LIFTING TECHNIQUES DO NOT:  Lock your knees when lifting, even if it is a small object. Bend and twist. Pivot at your feet or move your feet when needing to change directions. Assume that you can safely pick up even a paperclip without proper posture.

## 2024-11-04 LAB — HEPATITIS B SURFACE ANTIBODY, QUANTITATIVE: Hep B S AB Quant (Post): 84 m[IU]/mL

## 2024-11-06 ENCOUNTER — Encounter: Admitting: Family Medicine

## 2024-11-17 ENCOUNTER — Encounter: Payer: Self-pay | Admitting: Gastroenterology

## 2024-11-17 ENCOUNTER — Ambulatory Visit: Admitting: Gastroenterology

## 2024-11-17 VITALS — BP 123/87 | HR 73 | Temp 98.0°F | Resp 12 | Ht 70.0 in | Wt 179.0 lb

## 2024-11-17 DIAGNOSIS — K3189 Other diseases of stomach and duodenum: Secondary | ICD-10-CM | POA: Diagnosis not present

## 2024-11-17 DIAGNOSIS — K2950 Unspecified chronic gastritis without bleeding: Secondary | ICD-10-CM

## 2024-11-17 DIAGNOSIS — K2270 Barrett's esophagus without dysplasia: Secondary | ICD-10-CM | POA: Diagnosis not present

## 2024-11-17 DIAGNOSIS — K2970 Gastritis, unspecified, without bleeding: Secondary | ICD-10-CM

## 2024-11-17 DIAGNOSIS — K449 Diaphragmatic hernia without obstruction or gangrene: Secondary | ICD-10-CM

## 2024-11-17 DIAGNOSIS — R1115 Cyclical vomiting syndrome unrelated to migraine: Secondary | ICD-10-CM

## 2024-11-17 DIAGNOSIS — K219 Gastro-esophageal reflux disease without esophagitis: Secondary | ICD-10-CM

## 2024-11-17 MED ORDER — SODIUM CHLORIDE 0.9 % IV SOLN: 500.0000 mL | Freq: Once | INTRAVENOUS | Status: DC

## 2024-11-17 NOTE — Patient Instructions (Signed)

## 2024-11-17 NOTE — Progress Notes (Signed)
 Report to PACU, RN, vss, BBS= Clear.

## 2024-11-17 NOTE — Op Note (Signed)
 [facility] Patient Name: Reginald Dunn Procedure Date: 11/17/2024 9:20 AM MRN: [id number] Endoscopist: Sandor Flatter , MD, [id number] Age: 44 Referring MD:  Date of Birth: 1981/01/14 Gender: Male Account #: [id number] Procedure:                Upper GI endoscopy Indications:              Heartburn, Suspected esophageal reflux,                            Preoperative assessment, Nausea with vomiting Medicines:                Monitored Anesthesia Care Procedure:                Pre-Anesthesia Assessment:                           - Prior to the procedure, a History and Physical                            was performed, and patient medications and                            allergies were reviewed. The patient's tolerance of                            previous anesthesia was also reviewed. The risks                            and benefits of the procedure and the sedation                            options and risks were discussed with the patient.                            All questions were answered, and informed consent                            was obtained. Prior Anticoagulants: The patient has                            taken no anticoagulant or antiplatelet agents. ASA                            Grade Assessment: III - A patient with severe                            systemic disease. After reviewing the risks and                            benefits, the patient was deemed in satisfactory                            condition to undergo the procedure.  After obtaining informed consent, the endoscope was                            passed under direct vision. Throughout the                            procedure, the patient's blood pressure, pulse, and                            oxygen saturations were monitored continuously. The                            Olympus Scope [id number] was introduced through the                            mouth, and  advanced to the second part of duodenum.                            The upper GI endoscopy was accomplished without                            difficulty. The patient tolerated the procedure                            well. Scope In: Scope Out: Findings:                 The upper third of the esophagus and middle third                            of the esophagus were normal.                           Two tongues of salmon-colored mucosa were present                            from 36 to 38 cm. No other visible abnormalities                            were present. The maximum longitudinal extent of                            these esophageal mucosal changes was 2 cm in                            length. Biopsies were taken with a cold forceps for                            histology. Estimated blood loss was minimal.                           A 1 cm hiatal hernia was present.  Evidence of a Nissen fundoplication was found in                            the cardia. The wrap appeared loose. This was                            traversed.                           Scattered mild inflammation characterized by                            congestion (edema) was found in the gastric body                            and in the gastric antrum. Biopsies were taken with                            a cold forceps for Helicobacter pylori testing.                            Estimated blood loss was minimal.                           Localized moderate inflammation characterized by                            erythema was found in the cardia. This is similar                            in appearance the to the prior endoscopy and                            appears most consistent with focal emetogenic                            gastritis. Biopsies were taken with a cold forceps                            for histology. Estimated blood loss was minimal.                           The  examined duodenum was normal. Complications:            No immediate complications. Estimated Blood Loss:     Estimated blood loss was minimal. Impression:               - Normal upper third of esophagus and middle third                            of esophagus.                           - Salmon-colored mucosa. Biopsied.                           -  1 cm hiatal hernia.                           - A Nissen fundoplication was found. The wrap                            appears loose.                           - Gastritis. Biopsied.                           - Gastritis. Biopsied.                           - Normal examined duodenum. Recommendation:           - Patient has a contact number available for                            emergencies. The signs and symptoms of potential                            delayed complications were discussed with the                            patient. Return to normal activities tomorrow.                            Written discharge instructions were provided to the                            patient.                           - Resume previous diet.                           - Continue present medications.                           - Await pathology results.                           - Appointment with Dr. Tanda in the [facility]                            [facility] next week as scheduled. Sandor Flatter, MD 11/17/2024 10:12:05 AM

## 2024-11-17 NOTE — Progress Notes (Signed)
 "   GASTROENTEROLOGY PROCEDURE H&P NOTE   Primary Care Physician: Frann Mabel Mt, DO    Reason for Procedure:   GERD, hiatal hernia, pre-operative evaluation, nausea/vomiting  Plan:    EGD  Patient is appropriate for endoscopic procedure(s) in the ambulatory ([facility]) setting.  The nature of the procedure, as well as the risks, benefits, and alternatives were carefully and thoroughly reviewed with the patient. Ample time for discussion and questions allowed. The patient understood, was satisfied, and agreed to proceed. I personally addressed all patient questions and concerns.     HPI: Reginald Dunn is a 44 y.o. male who presents for EGD for evaluation of persistent reflux sxs along with nausea/vomiting. Scheduled for OV with Dr. Tanda on 11/21/2024 to discuss redo HHR and fundoplication. Prior HHR and Nissen in 2017.   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 EGD n/f HH per mother and lap HH repair with fundoplication (unsure if complete or partial) completed. Reflux sxs resolved and able to stop all antacids.  Of note, was unable to vomit postop until 2020.   Endoscopic History: -EGD (04/20/2019, Dr. San): 2 tongues of salmon-colored mucosa, 1 cm (biopsies benign), loose Nissen fundoplication with small hiatal hernia, small amount of food residue in gastric body, severely erythematous and edematous mucosa in gastric cardia (biopsies negative for H. pylori), with otherwise normal stomach and duodenum.  Recommended GES.   - CT A/P (11/2018): Small hiatal hernia, otherwise normal GI tract - GES (05/2019): Mildly delayed emptying, with 66% and 73% emptying at 3 and 4 hours - CT head (05/2019): Normal  Past Medical History:  Diagnosis Date   ADHD    Allergy    seasonal   Blood transfusion without reported diagnosis    2017   Depression    GERD (gastroesophageal reflux disease)    Loss of  appetite    Nausea & vomiting    Social anxiety disorder     Past Surgical History:  Procedure Laterality Date   ANTERIOR CRUCIATE LIGAMENT REPAIR Left 1998   HERNIA REPAIR  2015   HIATAL HERNIA REPAIR  08/2016    Prior to Admission medications  Medication Sig Start Date End Date Taking? Authorizing Provider  ALPRAZolam  (XANAX ) 1 MG tablet Take 1 tablet (1 mg total) by mouth 3 (three) times daily as needed for anxiety. 07/31/24  Yes Rhys Boyer T, PA-C  gabapentin  (NEURONTIN ) 600 MG tablet Take 2 tablets (1,200 mg total) by mouth 3 (three) times daily. 07/31/24  Yes Rhys Boyer T, PA-C  pantoprazole  (PROTONIX ) 40 MG tablet Take 1 tablet (40 mg total) by mouth 2 (two) times daily before a meal. 09/12/24  Yes Jerris Fleer V, DO  PARoxetine  (PAXIL ) 10 MG tablet Take 1 tablet (10 mg total) by mouth daily. 07/31/24  Yes Hurst, Boyer DASEN, PA-C  PARoxetine  (PAXIL ) 20 MG tablet Take 1 tablet (20 mg total) by mouth daily. 07/31/24  Yes Hurst, Boyer T, PA-C  QUEtiapine  (SEROQUEL ) 400 MG tablet TAKE 2 AND 1/2 TABLETS BY MOUTH EVERY NIGHT AT BEDTIME 07/31/24  Yes Hurst, Teresa T, PA-C  risperiDONE  (RISPERDAL ) 1 MG tablet Take 1 tablet (1 mg total) by mouth at bedtime. With 4 mg=5 mg qhs 07/31/24  Yes Hurst, Boyer DASEN, PA-C  risperidone  (RISPERDAL ) 4 MG tablet 1 qhs w/ 1mg  =5 mg 07/31/24  Yes Hurst, Teresa T, PA-C  Vitamin D , Ergocalciferol , (DRISDOL ) 1.25 MG (50000 UNIT) CAPS  capsule TAKE 1 CAPSULE BY MOUTH EVERY 7 DAYS 09/29/24  Yes Frann Mabel Mt, DO  albuterol  (VENTOLIN  HFA) 108 (90 Base) MCG/ACT inhaler Inhale 1 puff into the lungs every 6 (six) hours as needed for wheezing or shortness of breath. 11/03/23   Frann Mabel Mt, DO  SUMAtriptan  (IMITREX ) 20 MG/ACT nasal spray Place 1 spray (20 mg total) into the nose as needed. Use intranasal as needed for nausea and vomiting. Patient not taking: Reported on 11/17/2024 09/12/24   Landree Fernholz V, DO    Current Outpatient Medications   Medication Sig Dispense Refill   ALPRAZolam  (XANAX ) 1 MG tablet Take 1 tablet (1 mg total) by mouth 3 (three) times daily as needed for anxiety. 90 tablet 5   gabapentin  (NEURONTIN ) 600 MG tablet Take 2 tablets (1,200 mg total) by mouth 3 (three) times daily. 180 tablet 5   pantoprazole  (PROTONIX ) 40 MG tablet Take 1 tablet (40 mg total) by mouth 2 (two) times daily before a meal. 180 tablet 3   PARoxetine  (PAXIL ) 10 MG tablet Take 1 tablet (10 mg total) by mouth daily. 30 tablet 5   PARoxetine  (PAXIL ) 20 MG tablet Take 1 tablet (20 mg total) by mouth daily. 30 tablet 5   QUEtiapine  (SEROQUEL ) 400 MG tablet TAKE 2 AND 1/2 TABLETS BY MOUTH EVERY NIGHT AT BEDTIME 75 tablet 5   risperiDONE  (RISPERDAL ) 1 MG tablet Take 1 tablet (1 mg total) by mouth at bedtime. With 4 mg=5 mg qhs 30 tablet 5   risperidone  (RISPERDAL ) 4 MG tablet 1 qhs w/ 1mg  =5 mg 30 tablet 5   Vitamin D , Ergocalciferol , (DRISDOL ) 1.25 MG (50000 UNIT) CAPS capsule TAKE 1 CAPSULE BY MOUTH EVERY 7 DAYS 12 capsule 0   albuterol  (VENTOLIN  HFA) 108 (90 Base) MCG/ACT inhaler Inhale 1 puff into the lungs every 6 (six) hours as needed for wheezing or shortness of breath. 18 g 0   SUMAtriptan  (IMITREX ) 20 MG/ACT nasal spray Place 1 spray (20 mg total) into the nose as needed. Use intranasal as needed for nausea and vomiting. (Patient not taking: Reported on 11/17/2024) 12 each 1   Current Facility-Administered Medications  Medication Dose Route Frequency Provider Last Rate Last Admin   0.9 %  sodium chloride  infusion  500 mL Intravenous Once Rashod Gougeon V, DO        Allergies as of 11/17/2024   (No Known Allergies)    Family History  Problem Relation Age of Onset   Arthritis Mother    Hearing loss Mother    Colon polyps Mother    Colon cancer Neg Hx    Esophageal cancer Neg Hx    Pancreatic cancer Neg Hx    Prostate cancer Neg Hx    Rectal cancer Neg Hx     Social History   Socioeconomic History   Marital status: Single     Spouse name: Not on file   Number of children: Not on file   Years of education: Not on file   Highest education level: 12th grade  Occupational History   Not on file  Tobacco Use   Smoking status: Every Day    Current packs/day: 0.50    Average packs/day: 0.5 packs/day for 20.0 years (10.0 ttl pk-yrs)    Types: Cigarettes   Smokeless tobacco: Never  Vaping Use   Vaping status: Never Used  Substance and Sexual Activity   Alcohol use: Not Currently    Alcohol/week: 1.0 standard drink of alcohol    Types:  1 Cans of beer per week    Comment: socially q3 months   Drug use: Never   Sexual activity: Not Currently  Other Topics Concern   Not on file  Social History Narrative   Not on file   Social Drivers of Health   Tobacco Use: High Risk (11/17/2024)   Patient History    Smoking Tobacco Use: Every Day    Smokeless Tobacco Use: Never    Passive Exposure: Not on file  Financial Resource Strain: Low Risk (10/25/2024)   Overall Financial Resource Strain (CARDIA)    Difficulty of Paying Living Expenses: Not hard at all  Food Insecurity: No Food Insecurity (10/25/2024)   Epic    Worried About Programme Researcher, Broadcasting/film/video in the Last Year: Never true    Ran Out of Food in the Last Year: Never true  Transportation Needs: No Transportation Needs (10/25/2024)   Epic    Lack of Transportation (Medical): No    Lack of Transportation (Non-Medical): No  Physical Activity: Inactive (10/25/2024)   Exercise Vital Sign    Days of Exercise per Week: 0 days    Minutes of Exercise per Session: Not on file  Stress: No Stress Concern Present (10/25/2024)   [employer] of Occupational Health - Occupational Stress Questionnaire    Feeling of Stress: Only a little  Recent Concern: Stress - Stress Concern Present (07/28/2024)   [employer] of Occupational Health - Occupational Stress Questionnaire    Feeling of Stress: Rather much  Social Connections: Unknown (10/25/2024)   Social  Connection and Isolation Panel    Frequency of Communication with Friends and Family: Never    Frequency of Social Gatherings with Friends and Family: Patient declined    Attends Religious Services: Never    Database Administrator or Organizations: No    Attends Engineer, Structural: Not on file    Marital Status: Never married  Intimate Partner Violence: Not At Risk (07/11/2024)   Epic    Fear of Current or Ex-Partner: No    Emotionally Abused: No    Physically Abused: No    Sexually Abused: No  Depression (PHQ2-9): Low Risk (11/01/2024)   Depression (PHQ2-9)    PHQ-2 Score: 0  Alcohol Screen: Low Risk (10/25/2024)   Alcohol Screen    Last Alcohol Screening Score (AUDIT): 2  Housing: Low Risk (10/25/2024)   Epic    Unable to Pay for Housing in the Last Year: No    Number of Times Moved in the Last Year: 0    Homeless in the Last Year: No  Utilities: Not At Risk (07/11/2024)   Epic    Threatened with loss of utilities: No  Health Literacy: Adequate Health Literacy (07/11/2024)   B1300 Health Literacy    Frequency of need for help with medical instructions: Never    Physical Exam: Vital signs in last 24 hours: @BP  127/84   Pulse 89   Temp 98 F (36.7 C) (Temporal)   Ht 5' 10 (1.778 m)   Wt 179 lb (81.2 kg)   SpO2 97%   BMI 25.68 kg/m  GEN: NAD EYE: Sclerae anicteric ENT: MMM CV: Non-tachycardic Pulm: CTA b/l GI: Soft, NT/ND NEURO:  Alert & Oriented x 3   Sandor Flatter, DO [facility]   11/17/2024 9:26 AM  "

## 2024-11-17 NOTE — Progress Notes (Signed)
 I have reviewed the patient's medical history in detail and updated the computerized patient record.

## 2024-11-17 NOTE — Progress Notes (Signed)
 Called to room to assist during endoscopic procedure.  Patient ID and intended procedure confirmed with present staff. Received instructions for my participation in the procedure from the performing physician.

## 2024-11-20 ENCOUNTER — Telehealth: Payer: Self-pay

## 2024-11-20 NOTE — Telephone Encounter (Signed)
 Attempted to reach patient for post-procedure f/u call. No answer. Left message for him to please not hesitate to call if he has questions/concerns regarding his care.

## 2024-11-21 LAB — SURGICAL PATHOLOGY

## 2024-11-23 ENCOUNTER — Other Ambulatory Visit (HOSPITAL_COMMUNITY): Payer: Self-pay | Admitting: General Surgery

## 2024-11-23 ENCOUNTER — Ambulatory Visit: Payer: Self-pay | Admitting: Gastroenterology

## 2024-11-23 DIAGNOSIS — K219 Gastro-esophageal reflux disease without esophagitis: Secondary | ICD-10-CM

## 2024-11-28 ENCOUNTER — Ambulatory Visit (HOSPITAL_COMMUNITY)
Admission: RE | Admit: 2024-11-28 | Discharge: 2024-11-28 | Disposition: A | Discharge status: Home / Self Care | Source: Ambulatory Visit | Attending: General Surgery | Admitting: General Surgery

## 2024-11-28 DIAGNOSIS — K219 Gastro-esophageal reflux disease without esophagitis: Secondary | ICD-10-CM | POA: Diagnosis present

## 2024-11-28 MED ORDER — TECHNETIUM TC 99M MEBROFENIN IV KIT
5.0400 | PACK | Freq: Once | INTRAVENOUS | Status: AC | PRN
  Administered 2024-11-28: 5.04 via INTRAVENOUS

## 2024-12-04 ENCOUNTER — Ambulatory Visit (HOSPITAL_COMMUNITY)
Admission: RE | Admit: 2024-12-04 | Discharge: 2024-12-04 | Disposition: A | Discharge status: Home / Self Care | Source: Ambulatory Visit | Attending: General Surgery | Admitting: General Surgery

## 2024-12-04 DIAGNOSIS — K219 Gastro-esophageal reflux disease without esophagitis: Secondary | ICD-10-CM | POA: Insufficient documentation

## 2024-12-04 MED ORDER — TECHNETIUM TC 99M SULFUR COLLOID
2.0000 | Freq: Once | INTRAVENOUS | Status: AC | PRN
  Administered 2024-12-04: 2.2 via ORAL

## 2024-12-06 ENCOUNTER — Ambulatory Visit: Payer: Self-pay | Admitting: General Surgery

## 2024-12-08 ENCOUNTER — Ambulatory Visit: Payer: Self-pay | Admitting: General Surgery

## 2024-12-08 DIAGNOSIS — K811 Chronic cholecystitis: Secondary | ICD-10-CM

## 2024-12-15 ENCOUNTER — Encounter: Payer: Self-pay | Admitting: Family Medicine

## 2024-12-20 NOTE — Patient Instructions (Signed)
 SURGICAL WAITING ROOM VISITATION Patients having surgery or a procedure may have no more than 2 support people in the waiting area - these visitors may rotate in the visitor waiting room.   Due to an increase in RSV and influenza rates and associated hospitalizations, children ages 13 and under may not visit patients in [facility] hospitals. If the patient needs to stay at the hospital during part of their recovery, the visitor guidelines for inpatient rooms apply.  PRE-OP VISITATION  Pre-op nurse will coordinate an appropriate time for 1 support person to accompany the patient in pre-op.  This support person may not rotate.  This visitor will be contacted when the time is appropriate for the visitor to come back in the pre-op area.  Please refer to the [facility] website for the visitor guidelines for Inpatients (after your surgery is over and you are in a regular room).  You are not required to quarantine at this time prior to your surgery. However, you must do this: Hand Hygiene often Do NOT share personal items Notify your provider if you are in close contact with someone who has COVID or you develop fever 100.4 or greater, new onset of sneezing, cough, sore throat, shortness of breath or body aches.  If you test positive for Covid or have been in contact with anyone that has tested positive in the last 10 days please notify you surgeon.    Your procedure is scheduled on: 12/29/24   Report to [facility] Main Entrance: [place] entrance where the [employer] is available.   Report to admitting at: 7:00 AM  Call this number if you have any questions or problems the morning of surgery [phone]  FOLLOW ANY ADDITIONAL PRE OP INSTRUCTIONS YOU RECEIVED FROM YOUR SURGEON'S OFFICE!!!  Eat a light diet the day before surgery.  Examples including soups, broths, toast, yogurt, mashed potatoes.  Things to avoid include carbonated beverages (fizzy beverages), raw fruits and raw  vegetables, or beans.   If your bowels are filled with gas, your surgeon will have difficulty visualizing your pelvic organs which increases your surgical risks.  Do not eat food after Midnight the night prior to your surgery/procedure.  After Midnight you may have the following liquids until : 6:00 AM DAY OF SURGERY  Clear Liquid Diet Water Black Coffee (sugar ok, NO MILK/CREAM OR CREAMERS)  Tea (sugar ok, NO MILK/CREAM OR CREAMERS) regular and decaf                             Plain Jell-O  with no fruit (NO RED)                                           Fruit ices (not with fruit pulp, NO RED)                                     Popsicles (NO RED)  Juice: NO CITRUS JUICES: only apple, WHITE grape, WHITE cranberry Sports drinks like Gatorade or Powerade (NO RED)   Oral Hygiene is also important to reduce your risk of infection.        Remember - BRUSH YOUR TEETH THE MORNING OF SURGERY WITH YOUR REGULAR TOOTHPASTE  Do NOT smoke after Midnight the night before surgery.  STOP TAKING all Vitamins, Herbs and supplements 1 week before your surgery.   Take ONLY these medicines the morning of surgery with A SIP OF WATER: gabapentin ,paroxetine (Paxil ),pantoprazole .Alprazolam  as needed.Use inhalers as usual and bring them.                  You may not have any metal on your body including hair pins, jewelry, and body piercing  Do not wear lotions, powders, perfumes / cologne, or deodorant  Men may shave face and neck.  Contacts, Hearing Aids, dentures or bridgework may not be worn into surgery. DENTURES WILL BE REMOVED PRIOR TO SURGERY PLEASE DO NOT APPLY Poly grip OR ADHESIVES!!!  You may bring a small overnight bag with you on the day of surgery, only pack items that are not valuable. [facility] IS NOT RESPONSIBLE   FOR VALUABLES THAT ARE LOST OR STOLEN.   Patients discharged on the day of surgery will not be allowed to  drive home.  Someone NEEDS to stay with you for the first 24 hours after anesthesia.  Do not bring your home medications to the hospital. The Pharmacy will dispense medications listed on your medication list to you during your admission in the Hospital.  Special Instructions: Bring a copy of your healthcare power of attorney and living will documents the day of surgery, if you wish to have them scanned into your [facility] Records- EPIC  Please read over the following fact sheets you were given: IF YOU HAVE QUESTIONS ABOUT YOUR PRE-OP INSTRUCTIONS, PLEASE CALL [phone]   [facility] - Preparing for Surgery      Before surgery, you can play an important role.  Because skin is not sterile, your skin needs to be as free of germs as possible.  You can reduce the number of germs on your skin by washing with CHG (chlorahexidine gluconate) soap before surgery.  CHG is an antiseptic cleaner which kills germs and bonds with the skin to continue killing germs even after washing. Please DO NOT use if you have an allergy to CHG or antibacterial soaps.  If your skin becomes reddened/irritated stop using the CHG and inform your nurse when you arrive at Short Stay. Do not shave (including legs and underarms) for at least 48 hours prior to the first CHG shower.  You may shave your face/neck.  Please follow these instructions carefully:  1.  Shower with CHG Soap the night before surgery ONLY (DO NOT USE THE SOAP THE MORNING OF SURGERY).  2.  If you choose to wash your hair, wash your hair first as usual with your normal  shampoo.  3.  After you shampoo, rinse your hair and body thoroughly to remove the shampoo.                             4.  Use CHG as you would any other liquid soap.  You can apply chg directly to the skin and wash.  Gently with a scrungie or clean washcloth.  5.  Apply the CHG Soap to your body ONLY FROM THE NECK  DOWN.   Do not use on face/ open                           Wound or  open sores. Avoid contact with eyes, ears mouth and genitals (private parts).                       Wash face,  Genitals (private parts) with your normal soap.             6.  Wash thoroughly, paying special attention to the area where your  surgery  will be performed.  7.  Thoroughly rinse your body with warm water from the neck down.  8.  DO NOT shower/wash with your normal soap after using and rinsing off the CHG Soap.                9.  Pat yourself dry with a clean towel.            10.  Wear clean pajamas.            11.  Place clean sheets on your bed the night of your first shower and do not  sleep with pets.  Day of Surgery : Do not apply any CHG, lotions/deodorants the morning of surgery.  Please wear clean clothes to the hospital/surgery center.   FAILURE TO FOLLOW THESE INSTRUCTIONS MAY RESULT IN THE CANCELLATION OF YOUR SURGERY  PATIENT SIGNATURE_________________________________  NURSE SIGNATURE__________________________________  ________________________________________________________________________

## 2024-12-21 ENCOUNTER — Encounter (HOSPITAL_COMMUNITY): Payer: Self-pay

## 2024-12-21 ENCOUNTER — Other Ambulatory Visit: Payer: Self-pay

## 2024-12-21 ENCOUNTER — Encounter (HOSPITAL_COMMUNITY)
Admission: RE | Admit: 2024-12-21 | Discharge: 2024-12-21 | Disposition: A | Source: Ambulatory Visit | Attending: General Surgery

## 2024-12-21 DIAGNOSIS — K811 Chronic cholecystitis: Secondary | ICD-10-CM

## 2024-12-21 HISTORY — DX: Bipolar disorder, unspecified: F31.9

## 2024-12-21 HISTORY — DX: Chronic kidney disease, unspecified: N18.9

## 2024-12-21 LAB — COMPREHENSIVE METABOLIC PANEL WITH GFR
ALT: 16 U/L (ref 0–44)
AST: 24 U/L (ref 15–41)
Albumin: 4.2 g/dL (ref 3.5–5.0)
Alkaline Phosphatase: 117 U/L (ref 38–126)
Anion gap: 12 (ref 5–15)
BUN: 12 mg/dL (ref 6–20)
CO2: 22 mmol/L (ref 22–32)
Calcium: 10.3 mg/dL (ref 8.9–10.3)
Chloride: 100 mmol/L (ref 98–111)
Creatinine, Ser: 1.8 mg/dL — ABNORMAL HIGH (ref 0.61–1.24)
GFR, Estimated: 47 mL/min — ABNORMAL LOW
Glucose, Bld: 92 mg/dL (ref 70–99)
Potassium: 4.7 mmol/L (ref 3.5–5.1)
Sodium: 135 mmol/L (ref 135–145)
Total Bilirubin: 0.2 mg/dL (ref 0.0–1.2)
Total Protein: 7.1 g/dL (ref 6.5–8.1)

## 2024-12-21 LAB — CBC WITH DIFFERENTIAL/PLATELET
Abs Immature Granulocytes: 0.02 10*3/uL (ref 0.00–0.07)
Basophils Absolute: 0.1 10*3/uL (ref 0.0–0.1)
Basophils Relative: 1 %
Eosinophils Absolute: 0.2 10*3/uL (ref 0.0–0.5)
Eosinophils Relative: 3 %
HCT: 39.2 % (ref 39.0–52.0)
Hemoglobin: 12.7 g/dL — ABNORMAL LOW (ref 13.0–17.0)
Immature Granulocytes: 0 %
Lymphocytes Relative: 33 %
Lymphs Abs: 2.6 10*3/uL (ref 0.7–4.0)
MCH: 32.3 pg (ref 26.0–34.0)
MCHC: 32.4 g/dL (ref 30.0–36.0)
MCV: 99.7 fL (ref 80.0–100.0)
Monocytes Absolute: 0.8 10*3/uL (ref 0.1–1.0)
Monocytes Relative: 10 %
Neutro Abs: 4.3 10*3/uL (ref 1.7–7.7)
Neutrophils Relative %: 53 %
Platelets: 269 10*3/uL (ref 150–400)
RBC: 3.93 MIL/uL — ABNORMAL LOW (ref 4.22–5.81)
RDW: 13.4 % (ref 11.5–15.5)
WBC: 7.9 10*3/uL (ref 4.0–10.5)
nRBC: 0 % (ref 0.0–0.2)

## 2024-12-21 NOTE — Progress Notes (Signed)
 For Anesthesia: PCP - Frann Mabel Mt, DO  Cardiologist - N/A  Bowel Prep reminder:  Chest x-ray -  EKG - N/A Stress Test -  ECHO -  Cardiac Cath -  Pacemaker/ICD device last checked: Pacemaker orders received: Device Rep notified:  Spinal Cord Stimulator:N/A  Sleep Study - N/A CPAP -   Fasting Blood Sugar - N/A Checks Blood Sugar _____ times a day Date and result of last Hgb A1c-  Last dose of GLP1 agonist- N/A GLP1 instructions: Hold 7 days prior to schedule (Hold 24 hours-daily)   Last dose of SGLT-2 inhibitors- N/A SGLT-2 instructions: Hold 72 hours prior to surgery  Blood Thinner Instructions:N/A Last Dose: Time last taken:  Aspirin Instructions:N/A Last Dose: Time last taken:  Activity level: Can go up a flight of stairs and activities of daily living without stopping and without chest pain and/or shortness of breath   Able to exercise without chest pain and/or shortness of breath  Anesthesia review: Hx: Smoker,ADHD.   Patient denies shortness of breath, fever, cough and chest pain at PAT appointment   Patient verbalized understanding of instructions that were reviewed over the telephone.

## 2024-12-21 NOTE — Progress Notes (Signed)
 Lab. Results: Creatinine: 1.80

## 2024-12-25 ENCOUNTER — Other Ambulatory Visit

## 2024-12-29 ENCOUNTER — Ambulatory Visit (HOSPITAL_COMMUNITY): Admit: 2024-12-29 | Admitting: General Surgery

## 2024-12-29 ENCOUNTER — Encounter (HOSPITAL_COMMUNITY): Admission: RE | Payer: Self-pay | Source: Ambulatory Visit

## 2024-12-29 SURGERY — LAPAROSCOPIC CHOLECYSTECTOMY
Anesthesia: General

## 2025-01-16 ENCOUNTER — Other Ambulatory Visit

## 2025-01-29 ENCOUNTER — Ambulatory Visit: Admitting: Physician Assistant

## 2025-07-24 ENCOUNTER — Ambulatory Visit
